# Patient Record
Sex: Female | Born: 1945
Health system: Southern US, Community
[De-identification: ages and names within clinical notes are randomized; demographics above are authoritative.]

## PROBLEM LIST (undated history)

## (undated) DIAGNOSIS — K219 Gastro-esophageal reflux disease without esophagitis: Secondary | ICD-10-CM

## (undated) DIAGNOSIS — E079 Disorder of thyroid, unspecified: Secondary | ICD-10-CM

## (undated) DIAGNOSIS — T7840XA Allergy, unspecified, initial encounter: Secondary | ICD-10-CM

## (undated) DIAGNOSIS — H269 Unspecified cataract: Secondary | ICD-10-CM

## (undated) DIAGNOSIS — M1990 Unspecified osteoarthritis, unspecified site: Secondary | ICD-10-CM

## (undated) DIAGNOSIS — M810 Age-related osteoporosis without current pathological fracture: Secondary | ICD-10-CM

## (undated) DIAGNOSIS — I10 Essential (primary) hypertension: Secondary | ICD-10-CM

## (undated) DIAGNOSIS — Z8619 Personal history of other infectious and parasitic diseases: Secondary | ICD-10-CM

## (undated) DIAGNOSIS — K635 Polyp of colon: Secondary | ICD-10-CM

## (undated) HISTORY — DX: Gastro-esophageal reflux disease without esophagitis: K21.9

## (undated) HISTORY — DX: Disorder of thyroid, unspecified: E07.9

## (undated) HISTORY — DX: Unspecified osteoarthritis, unspecified site: M19.90

## (undated) HISTORY — DX: Polyp of colon: K63.5

## (undated) HISTORY — DX: Unspecified cataract: H26.9

## (undated) HISTORY — PX: BACK SURGERY: SHX140

## (undated) HISTORY — DX: Personal history of other infectious and parasitic diseases: Z86.19

## (undated) HISTORY — DX: Age-related osteoporosis without current pathological fracture: M81.0

## (undated) HISTORY — DX: Allergy, unspecified, initial encounter: T78.40XA

## (undated) HISTORY — DX: Essential (primary) hypertension: I10

---

## 1993-11-09 HISTORY — PX: CHOLECYSTECTOMY: SHX55

## 1993-11-09 HISTORY — PX: APPENDECTOMY: SHX54

## 1999-02-21 ENCOUNTER — Ambulatory Visit (HOSPITAL_COMMUNITY): Admission: RE | Admit: 1999-02-21 | Discharge: 1999-02-21 | Payer: Self-pay | Admitting: Family Medicine

## 1999-02-21 ENCOUNTER — Encounter: Payer: Self-pay | Admitting: Family Medicine

## 1999-04-15 ENCOUNTER — Ambulatory Visit (HOSPITAL_COMMUNITY): Admission: RE | Admit: 1999-04-15 | Discharge: 1999-04-15 | Payer: Self-pay | Admitting: *Deleted

## 1999-04-15 ENCOUNTER — Encounter: Payer: Self-pay | Admitting: *Deleted

## 1999-07-17 ENCOUNTER — Other Ambulatory Visit: Admission: RE | Admit: 1999-07-17 | Discharge: 1999-07-17 | Payer: Self-pay | Admitting: *Deleted

## 1999-07-18 ENCOUNTER — Other Ambulatory Visit: Admission: RE | Admit: 1999-07-18 | Discharge: 1999-07-18 | Payer: Self-pay | Admitting: *Deleted

## 2000-01-07 ENCOUNTER — Other Ambulatory Visit: Admission: RE | Admit: 2000-01-07 | Discharge: 2000-01-07 | Payer: Self-pay | Admitting: *Deleted

## 2000-04-29 ENCOUNTER — Encounter: Admission: RE | Admit: 2000-04-29 | Discharge: 2000-04-29 | Payer: Self-pay | Admitting: *Deleted

## 2000-04-29 ENCOUNTER — Ambulatory Visit (HOSPITAL_COMMUNITY): Admission: RE | Admit: 2000-04-29 | Discharge: 2000-04-29 | Payer: Self-pay | Admitting: *Deleted

## 2000-04-29 ENCOUNTER — Encounter: Payer: Self-pay | Admitting: *Deleted

## 2000-06-22 ENCOUNTER — Other Ambulatory Visit: Admission: RE | Admit: 2000-06-22 | Discharge: 2000-06-22 | Payer: Self-pay | Admitting: *Deleted

## 2000-07-26 ENCOUNTER — Other Ambulatory Visit: Admission: RE | Admit: 2000-07-26 | Discharge: 2000-07-26 | Payer: Self-pay | Admitting: *Deleted

## 2000-07-27 ENCOUNTER — Other Ambulatory Visit: Admission: RE | Admit: 2000-07-27 | Discharge: 2000-07-27 | Payer: Self-pay | Admitting: *Deleted

## 2000-07-27 ENCOUNTER — Encounter (INDEPENDENT_AMBULATORY_CARE_PROVIDER_SITE_OTHER): Payer: Self-pay

## 2001-02-02 ENCOUNTER — Other Ambulatory Visit: Admission: RE | Admit: 2001-02-02 | Discharge: 2001-02-02 | Payer: Self-pay | Admitting: *Deleted

## 2001-05-17 ENCOUNTER — Encounter: Payer: Self-pay | Admitting: *Deleted

## 2001-05-17 ENCOUNTER — Ambulatory Visit (HOSPITAL_COMMUNITY): Admission: RE | Admit: 2001-05-17 | Discharge: 2001-05-17 | Payer: Self-pay | Admitting: *Deleted

## 2001-05-19 ENCOUNTER — Encounter: Payer: Self-pay | Admitting: *Deleted

## 2001-05-19 ENCOUNTER — Encounter: Admission: RE | Admit: 2001-05-19 | Discharge: 2001-05-19 | Payer: Self-pay | Admitting: *Deleted

## 2001-08-19 ENCOUNTER — Other Ambulatory Visit: Admission: RE | Admit: 2001-08-19 | Discharge: 2001-08-19 | Payer: Self-pay | Admitting: *Deleted

## 2001-10-22 ENCOUNTER — Emergency Department (HOSPITAL_COMMUNITY): Admission: EM | Admit: 2001-10-22 | Discharge: 2001-10-22 | Payer: Self-pay | Admitting: Emergency Medicine

## 2002-03-01 ENCOUNTER — Other Ambulatory Visit: Admission: RE | Admit: 2002-03-01 | Discharge: 2002-03-01 | Payer: Self-pay | Admitting: Obstetrics and Gynecology

## 2002-03-06 ENCOUNTER — Encounter: Payer: Self-pay | Admitting: Obstetrics and Gynecology

## 2002-03-06 ENCOUNTER — Encounter: Admission: RE | Admit: 2002-03-06 | Discharge: 2002-03-06 | Payer: Self-pay | Admitting: Obstetrics and Gynecology

## 2002-08-21 ENCOUNTER — Encounter: Admission: RE | Admit: 2002-08-21 | Discharge: 2002-08-21 | Payer: Self-pay | Admitting: Obstetrics and Gynecology

## 2002-08-21 ENCOUNTER — Encounter: Payer: Self-pay | Admitting: Obstetrics and Gynecology

## 2002-08-25 ENCOUNTER — Emergency Department (HOSPITAL_COMMUNITY): Admission: EM | Admit: 2002-08-25 | Discharge: 2002-08-25 | Payer: Self-pay | Admitting: Emergency Medicine

## 2002-08-30 ENCOUNTER — Encounter: Payer: Self-pay | Admitting: Urology

## 2002-08-30 ENCOUNTER — Encounter: Admission: RE | Admit: 2002-08-30 | Discharge: 2002-08-30 | Payer: Self-pay | Admitting: Urology

## 2002-08-31 ENCOUNTER — Ambulatory Visit (HOSPITAL_BASED_OUTPATIENT_CLINIC_OR_DEPARTMENT_OTHER): Admission: RE | Admit: 2002-08-31 | Discharge: 2002-08-31 | Payer: Self-pay | Admitting: Urology

## 2003-03-06 ENCOUNTER — Other Ambulatory Visit: Admission: RE | Admit: 2003-03-06 | Discharge: 2003-03-06 | Payer: Self-pay

## 2003-08-24 ENCOUNTER — Encounter: Admission: RE | Admit: 2003-08-24 | Discharge: 2003-08-24 | Payer: Self-pay | Admitting: Obstetrics and Gynecology

## 2003-08-24 ENCOUNTER — Encounter: Payer: Self-pay | Admitting: Obstetrics and Gynecology

## 2004-02-29 ENCOUNTER — Encounter: Admission: RE | Admit: 2004-02-29 | Discharge: 2004-02-29 | Payer: Self-pay | Admitting: Obstetrics and Gynecology

## 2004-03-18 ENCOUNTER — Other Ambulatory Visit: Admission: RE | Admit: 2004-03-18 | Discharge: 2004-03-18 | Payer: Self-pay | Admitting: Obstetrics and Gynecology

## 2004-08-29 ENCOUNTER — Encounter: Admission: RE | Admit: 2004-08-29 | Discharge: 2004-08-29 | Payer: Self-pay | Admitting: Obstetrics and Gynecology

## 2005-02-10 ENCOUNTER — Ambulatory Visit (HOSPITAL_COMMUNITY): Admission: RE | Admit: 2005-02-10 | Discharge: 2005-02-10 | Payer: Self-pay | Admitting: Family Medicine

## 2005-10-07 ENCOUNTER — Encounter: Admission: RE | Admit: 2005-10-07 | Discharge: 2005-10-07 | Payer: Self-pay | Admitting: Family Medicine

## 2005-10-16 ENCOUNTER — Ambulatory Visit (HOSPITAL_COMMUNITY): Admission: RE | Admit: 2005-10-16 | Discharge: 2005-10-16 | Payer: Self-pay | Admitting: Gastroenterology

## 2006-07-16 ENCOUNTER — Ambulatory Visit (HOSPITAL_COMMUNITY): Admission: RE | Admit: 2006-07-16 | Discharge: 2006-07-16 | Payer: Self-pay | Admitting: Gastroenterology

## 2006-07-16 ENCOUNTER — Encounter (INDEPENDENT_AMBULATORY_CARE_PROVIDER_SITE_OTHER): Payer: Self-pay | Admitting: Specialist

## 2006-10-08 ENCOUNTER — Encounter: Admission: RE | Admit: 2006-10-08 | Discharge: 2006-10-08 | Payer: Self-pay | Admitting: Obstetrics and Gynecology

## 2006-10-16 ENCOUNTER — Encounter: Admission: RE | Admit: 2006-10-16 | Discharge: 2006-10-16 | Payer: Self-pay | Admitting: Neurology

## 2006-10-20 ENCOUNTER — Encounter: Admission: RE | Admit: 2006-10-20 | Discharge: 2006-10-20 | Payer: Self-pay | Admitting: Obstetrics and Gynecology

## 2006-11-16 ENCOUNTER — Ambulatory Visit (HOSPITAL_COMMUNITY): Admission: RE | Admit: 2006-11-16 | Discharge: 2006-11-17 | Payer: Self-pay | Admitting: Neurosurgery

## 2007-08-31 ENCOUNTER — Other Ambulatory Visit: Admission: RE | Admit: 2007-08-31 | Discharge: 2007-08-31 | Payer: Self-pay | Admitting: Family Medicine

## 2007-10-21 ENCOUNTER — Encounter: Admission: RE | Admit: 2007-10-21 | Discharge: 2007-10-21 | Payer: Self-pay | Admitting: Family Medicine

## 2008-08-30 ENCOUNTER — Other Ambulatory Visit: Admission: RE | Admit: 2008-08-30 | Discharge: 2008-08-30 | Payer: Self-pay | Admitting: Family Medicine

## 2008-11-05 ENCOUNTER — Encounter: Admission: RE | Admit: 2008-11-05 | Discharge: 2008-11-05 | Payer: Self-pay | Admitting: Family Medicine

## 2009-08-08 ENCOUNTER — Encounter: Admission: RE | Admit: 2009-08-08 | Discharge: 2009-08-08 | Payer: Self-pay | Admitting: Family Medicine

## 2009-11-06 ENCOUNTER — Encounter: Admission: RE | Admit: 2009-11-06 | Discharge: 2009-11-06 | Payer: Self-pay | Admitting: Family Medicine

## 2010-11-17 ENCOUNTER — Encounter
Admission: RE | Admit: 2010-11-17 | Discharge: 2010-11-17 | Payer: Self-pay | Source: Home / Self Care | Attending: Family Medicine | Admitting: Family Medicine

## 2010-11-30 ENCOUNTER — Encounter: Payer: Self-pay | Admitting: Family Medicine

## 2011-03-27 NOTE — Op Note (Signed)
NAMEMAELYS, KINNICK NO.:  [id number]   MEDICAL RECORD NO.:  [id number]          PATIENT TYPE:  OIB   LOCATION:  3172                         FACILITY:  [facility]   PHYSICIAN:  Clydene Fake, M.D.  DATE OF BIRTH:  [birth year]   DATE OF PROCEDURE:  11/16/2006  DATE OF DISCHARGE:                               OPERATIVE REPORT   DIAGNOSIS:  Herniated nucleus pulposus left L4-5.   POSTOPERATIVE DIAGNOSIS:  Herniated nucleus pulposus left L4-5.   PROCEDURE:  Left L4-5 semi-hemilaminectomy, diskectomy, microdissection  with microscope.   SURGEON:  Clydene Fake, M.D.   ASSISTANT:  Danae Orleans. Venetia Maxon, M.D.   ANESTHESIA:  General endotracheal tube anesthesia.   ESTIMATED BLOOD LOSS:  Minimal.   FLUIDS GIVEN:  None.   DRAINS:  None.   INDICATIONS FOR PROCEDURE:  The patient is a 65 year old right-handed  woman who has had left leg pain and numbness since May.  No medications  have helped her. She was started on Neurontin.  An MRI was done then and  showed large 4-5 disk herniation left compressing the left L5 root.  The  patient brought in for decompression.   PROCEDURE IN DETAIL:  The patient was brought in the operating room,  general anesthesia was induced.  The patient was placed in prone  position on the Wilson frame with all pressure points padded.  The  patient was prepped, draped in sterile fashion.  Site of incision was  injected with 10 mL of 1% lidocaine with epinephrine.  Needle was placed  at interspace, X-ray was obtained showing this is the 5-1 interspace.  Incision was then made centered above where the needle was in the  midline lower lumbar spine, incision taken down to fascia.  Hemostasis  obtained with Bovie cauterization.  The fascia was incised over the L4-5  spinous processes.  Subperiosteal dissection was done over the spinous  process lamina out to facet.  Self-retaining tract was placed.  Marker  was placed at the 4-5 interspace.   X-rays obtained confirming our  positioning at 4-5.  Microscope was brought in for microdissection and  high-speed drill was used to start semi hemilaminectomy and medial  facetectomy with Kerrison punches.  Ligamentum flavum was removed and a  foraminotomy was done over the 5 root.  We dissected down to the  epidural space and found very large subligamentous disk herniation up  under the 5 root.  Carefully we reflected the dura and nerve root  medially, incised the disk space and diskectomy was performed with  pituitary rongeurs and curettes.  When we were finished, we had good  decompression of the thecal sac and the 5 nerve root.  We checked the 4  and 5 roots.  They were well decompressed.  We had good hemostasis with  bipolar cauterization, Gelfoam and thrombin.  Gelfoam was irrigated out.  We irrigated with antibiotic solution.  Retractors were then removed and  the fascia  closed with 0 Vicryl interrupted sutures.  Subcutaneous tissue closed  with 0, 2-0 and 3-0 Vicryl interrupted suture.  Skin closed with benzoin  and Steri-Strips.  Dressing was placed.  The patient was placed back on  supine position, awakened from anesthesia and returned to recovery room  in stable condition.           ______________________________  Clydene Fake, M.D.     JRH/MEDQ  D:  11/16/2006  T:  11/16/2006  Job:  [id number]

## 2011-03-27 NOTE — Op Note (Signed)
[name], [name] NO.:  [id number]   MEDICAL RECORD NO.:  [id number]          PATIENT TYPE:  AMB   LOCATION:  ENDO                         FACILITY:  [facility]   PHYSICIAN:  Anselmo Rod, M.D.  DATE OF BIRTH:  1946-02-03   DATE OF PROCEDURE:  10/15/2005  DATE OF DISCHARGE:                                 OPERATIVE REPORT   PROCEDURE PERFORMED:  Screening colonoscopy.   ENDOSCOPIST:  Anselmo Rod, M.D.   INSTRUMENT USED:  Olympus video colonoscope.   INDICATION FOR PROCEDURE:  A 65 year old white female undergoing screening  colonoscopy to rule out colonic polyps, masses, etc.   PREPROCEDURE PREPARATION:  Informed consent was procured from the patient.  The patient was fasted for four hours prior to the procedure and prepped  OsmoPrep pills the night of and the morning of the procedure.  The risks and  benefits of the procedure, including a 10% miss rate of cancer and polyps,  were discussed with the patient as well.   PREPROCEDURE PHYSICAL:  VITAL SIGNS:  The patient had stable vital signs.  NECK:  Supple.  CHEST:  Clear to auscultation.  S1, S2 regular.  ABDOMEN:  Soft with normal bowel sounds.   DESCRIPTION OF PROCEDURE:  The patient was placed in the left lateral  decubitus position and sedated with 75 mcg of fentanyl and 7.5 mg of Versed  in slow incremental doses.  Once the patient was adequately sedate and  maintained on low-flow oxygen and continuous cardiac monitoring, the Olympus  video colonoscope was advanced from the rectum to the cecum.  The  appendiceal orifice and the ileocecal valve were visualized and  photographed.  The terminal ileum appeared healthy.  No masses or polyps  were seen.  There were a few early diverticula seen in the distal sigmoid  colon.  Retroflexion in the rectum revealed no abnormality.  The patient  tolerated the procedure well without complication.   IMPRESSION:  Very small early diverticula seen in the  sigmoid colon,  otherwise normal colonoscopy up to the terminal ileum.   RECOMMENDATIONS:  1.Continue a high-fiber diet with liberal fluid intake.  2.Brochures on diverticulosis have been given to the patient for her  education.  3.A repeat colonoscopy has been recommended in the next 10 years unless the  patient develops any abnormal symptoms in the interim.  4.Outpatient follow-up as the need arises in the future.      Anselmo Rod, M.D.  Electronically Signed     JNM/MEDQ  D:  10/16/2005  T:  10/16/2005  Job:  [id number]   cc:   Tammy R. Collins [place], M.D.  Fax: [phone]   Maxie Better, M.D.  Fax: [phone]

## 2011-03-27 NOTE — Op Note (Signed)
NAMEKATHERYN, CULLITON NO.:  [id number]   MEDICAL RECORD NO.:  [id number]          PATIENT TYPE:  AMB   LOCATION:  ENDO                         FACILITY:  [facility]   PHYSICIAN:  Anselmo Rod, M.D.  DATE OF BIRTH:  [birth year]   DATE OF PROCEDURE:  07/16/2006  DATE OF DISCHARGE:                                 OPERATIVE REPORT   PROCEDURE PERFORMED:  Esophagogastroduodenoscopy with antral biopsies.   ENDOSCOPIST:  Anselmo Rod, M.D.   INSTRUMENT USED:  Olympus videoscopic pan endoscope.   INDICATIONS FOR PROCEDURE:  A 65 year old white female with a history of  abnormal weight loss, epigastric and right upper quadrant pain.  Patient had  a laparoscopic cholecystectomy in the past and has been on Actonel for  osteoporosis, rule out esophagitis, peptic ulcer disease, etc.   PRE-PROCEDURE PREPARATION:  Informed consent was procured from the patient.  The patient was fasted for four hours prior to the procedure.  Risks and  benefits of the procedure were discussed in great detail.   PRE-PROCEDURE PHYSICAL EXAMINATION:  VITAL SIGNS:  Stable vital signs.  NECK:  Supple.  CHEST:  Clear to auscultation.  HEART:  S1 and S2.  Regular.  ABDOMEN:  Soft with normal bowel sounds.   DESCRIPTION OF PROCEDURE:  The patient was placed in the left lateral  decubitus position and sedated with 60 mcg of fentanyl and 6 mg of Versed in  slow, incremental doses.  Once the patient was adequately sedated,  maintained on low flow oxygen and continuous cardiac monitoring, the Olympus  videoscopic pan endoscope was advanced with the mouthpiece over the tongue  into the esophagus.  Under direct vision, the entire esophagus appeared  normal with no evidence of ring, stricture, mass, esophagitis, or Barrett's  mucosa.  The scope was then advanced to the stomach.  A small amount of  sticky debris was seen in the stomach along the antrum.  Multiple washings  were done, but all of this  could not be easily washed away.  Prominent  antral folds were seen.  These were biopsied for pathology.  No ulcers,  masses, or polyps were identified.  The retroflexion into the high cardia  revealed evidence of a small hiatal hernia.  The entire esophagus and the  proximal stomach appeared normal.  The patient tolerated the procedure well  without complications.   IMPRESSION:  1.Prominent antral folds, biopsied.  2.A small hiatal hernia.  3.Sticky debris seen in the mid-body and antrum. Multiple washings done.  4.Normal-appearing esophagus and proximal small bowel.   RECOMMENDATIONS:  1.Await pathology results.  2.Follow up with Dr. Maxie Better to rule out gynecological causes for  abnormal weight loss.  3.Outpatient followup within the two weeks for further recommendations.      Anselmo Rod, M.D.  Electronically Signed     JNM/MEDQ  D:  07/16/2006  T:  07/16/2006  Job:  [id number]   cc:   Tammy R. Collins [place], M.D.  Maxie Better, M.D.

## 2011-03-27 NOTE — Op Note (Signed)
   NAME:  AZARIYA, FREEMAN NO.:  [id number]   MEDICAL RECORD NO.:  [id number]                   PATIENT TYPE:  AMB   LOCATION:  NESC                                 FACILITY:  [facility]   PHYSICIAN:  Melvyn Novas, M.D.                DATE OF BIRTH:  [birth year]   DATE OF PROCEDURE:  08/31/2002  DATE OF DISCHARGE:                                 OPERATIVE REPORT   PREOPERATIVE DIAGNOSES:  Right ureteral stones.   POSTOPERATIVE DIAGNOSES:  Right ureteral stones.   PROCEDURE:  1. Cystoscopy.  2. Right ureteroscopic laser lithotripsy.   ANESTHESIA:  General.   SURGEON:  Bertram Millard. Retta Diones, M.D.   ASSISTANT:  Melvyn Novas, M.D.   DRAINS:  None.   COMPLICATIONS:  None.   BRIEF HISTORY:  Ms. Gelardi is a 65 year old white female with a history of  right sided flank pain. A recent KUB demonstrated two large distal ureteral  stones. She therefore presents for ureteroscopic stone extraction.   DESCRIPTION OF PROCEDURE:  Following administration of antibiotics and  anesthesia, the patient was prepped and draped in the dorsal lithotomy  position. Standard cystoscopy was performed using a 22 French sheath. Of  note, prior to cystoscopy the urethra was dilated to 40 [place] using female  urethral sounds due to meatal stenosis. Under cystoscopic guidance as well  as fluoroscopic guidance, a 0.038 guidewire was used to cannulate the right  ureteral orifice and passed up the right ureter in a retrograde manner until  approximately it was in the renal pelvis. The cystoscope was then backed up  over the wire while keeping the wire in place. __________ ureteroscope was  used to perform right sided ureteroscopy. Immediately after entering the  right ureter,  approximately 2-3 cm proximal to the ureteral orifice a 3-4  mm stone was encountered. At that time, we passed a 355 mm laser fiber  through the ureteroscope and using the holmium laser proceeded to submit  the  stone to laser lithotripsy until fragments were less than 1 mm in size. I  then encountered another stone just proximal to this which was about 4 mm in  diameter and probably 8-10 mm in length. Again we performed ureteroscopic  lithotripsy until the stone was pulverized. This was performed  atraumatically. At this point, we therefore elected not to place a ureteral  stent. The ureteroscope was removed and the wire was removed as well. The  bladder was then drained and the procedure terminated.                                               Melvyn Novas, M.D.    DK/MEDQ  D:  08/31/2002  T:  08/31/2002  Job:  [id number]

## 2011-07-23 ENCOUNTER — Other Ambulatory Visit: Payer: Self-pay | Admitting: Gastroenterology

## 2011-11-18 ENCOUNTER — Other Ambulatory Visit: Payer: Self-pay | Admitting: Family Medicine

## 2011-11-18 DIAGNOSIS — M899 Disorder of bone, unspecified: Secondary | ICD-10-CM

## 2011-11-18 DIAGNOSIS — Z1231 Encounter for screening mammogram for malignant neoplasm of breast: Secondary | ICD-10-CM

## 2011-11-18 DIAGNOSIS — M949 Disorder of cartilage, unspecified: Secondary | ICD-10-CM

## 2011-11-19 IMAGING — MG MM DIGITAL SCREENING
4 series · 4 of 4 positions shown · non-contrast
Comparison: Prior studies.

DG SCREEN MAMMOGRAM BILATERAL
Bilateral CC and MLO view(s) were taken.

DIGITAL SCREENING MAMMOGRAM WITH CAD:

[R CC]
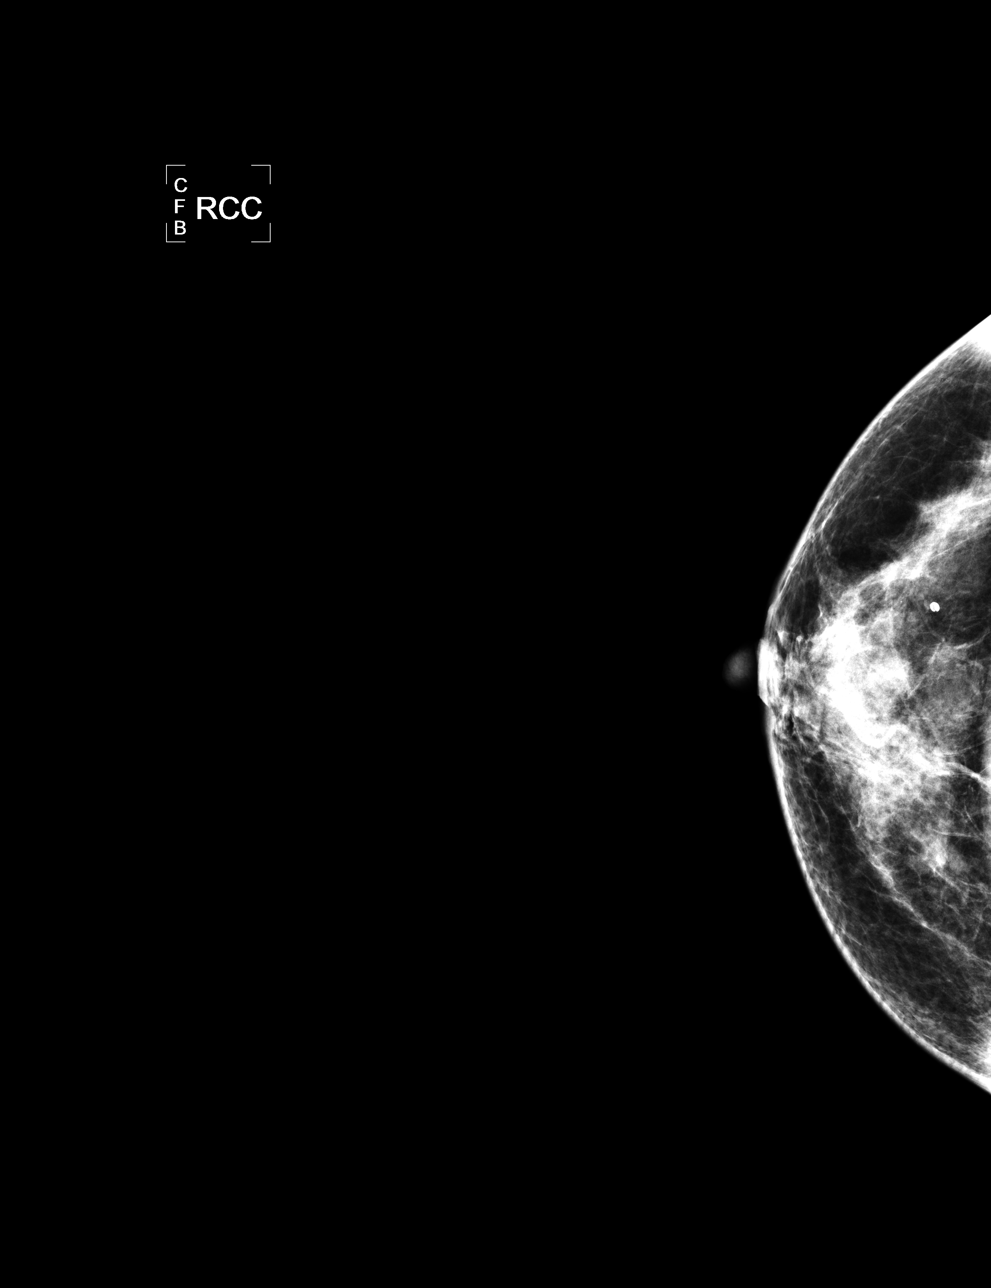

[L CC]
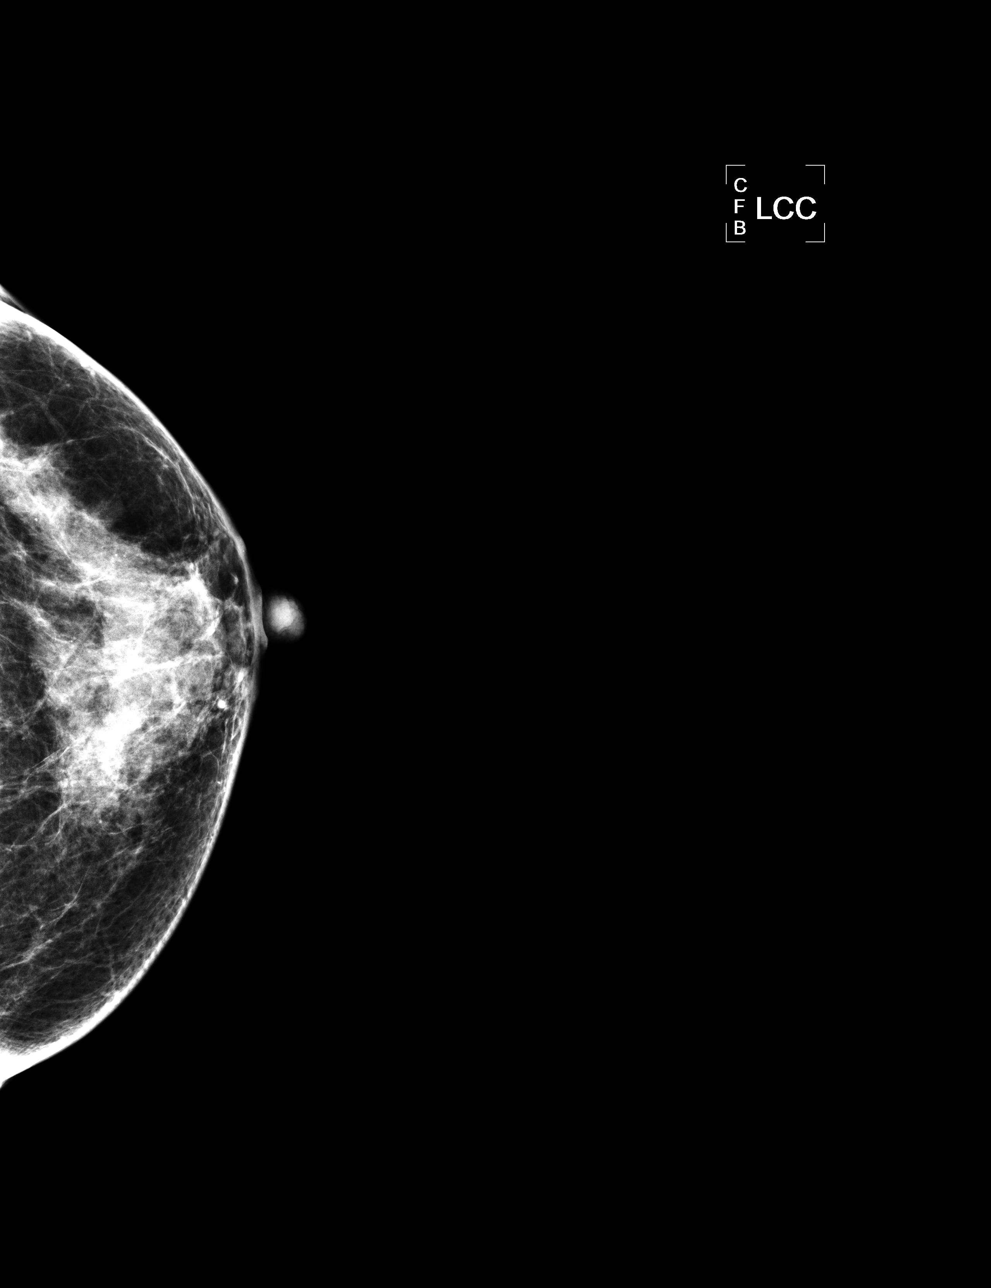

[L MLO]
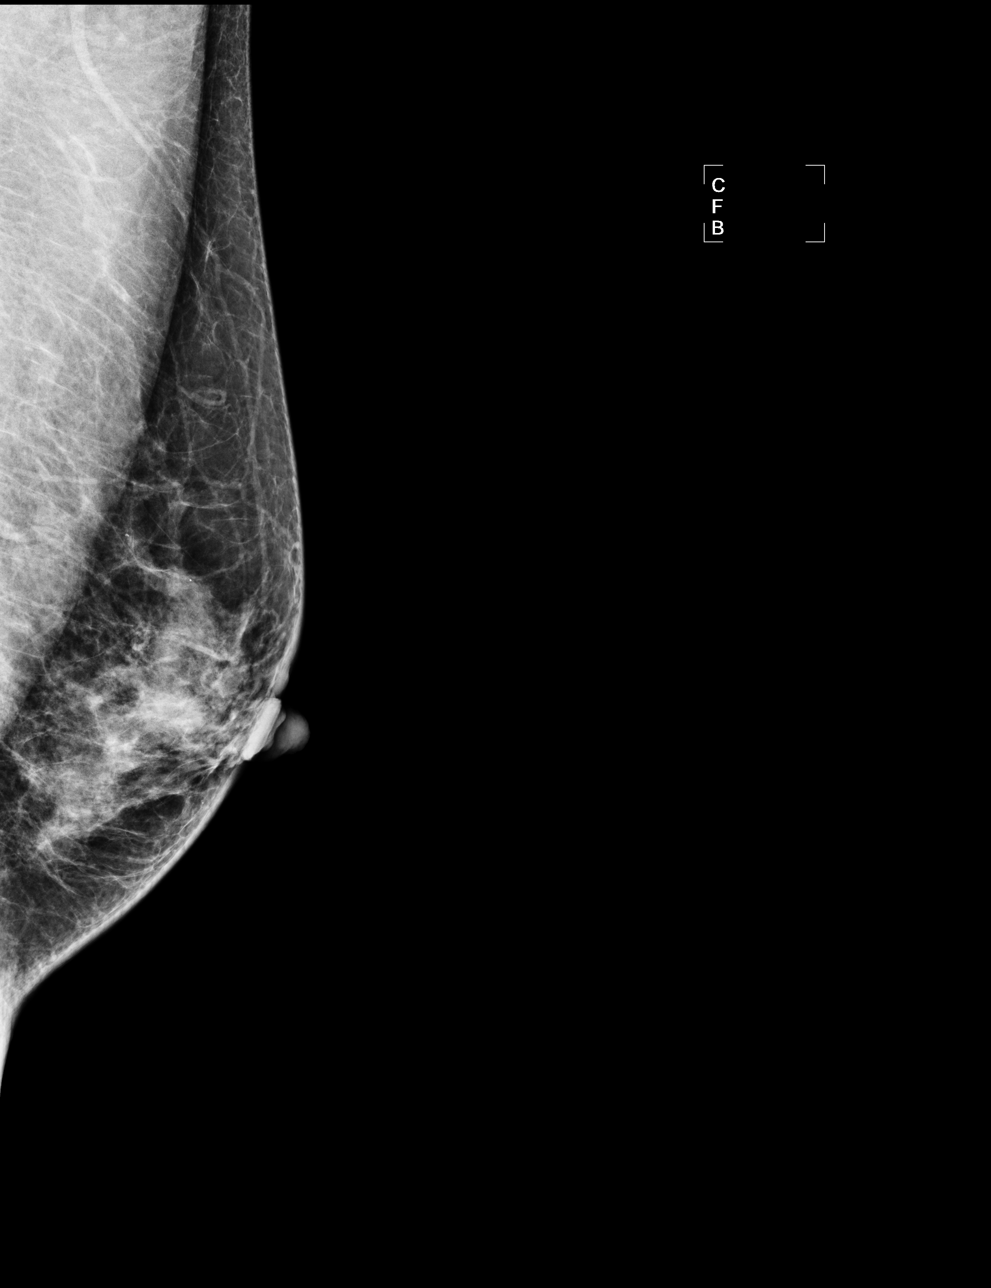

[R MLO]
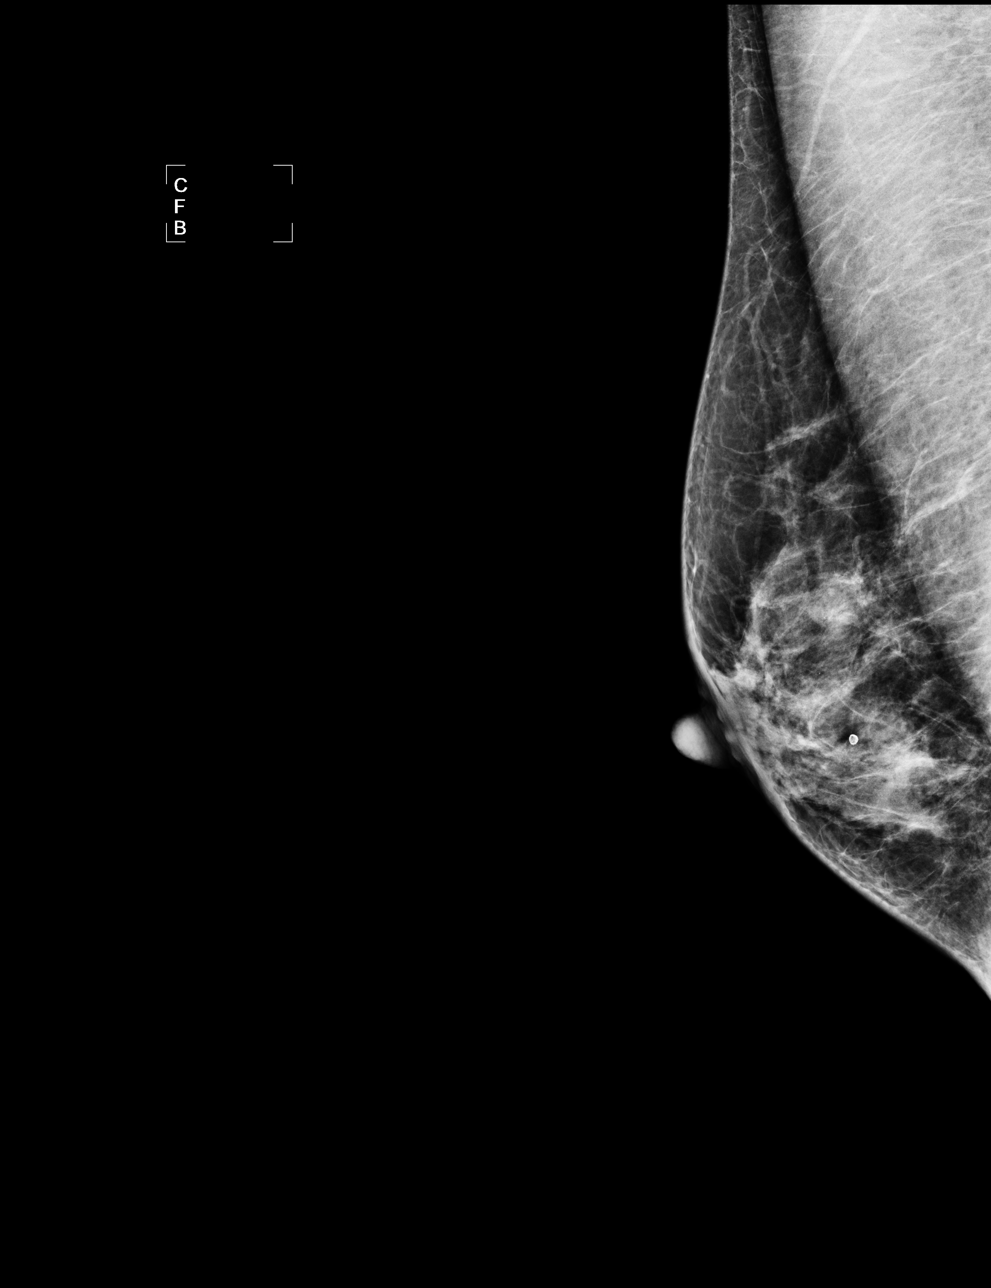

[4 of 4 positions shown; findings below may reference images not displayed]

The breast tissue is heterogeneously dense.  There is no dominant mass, architectural distortion or
calcification to suggest malignancy.

Images were processed with CAD.
IMPRESSION: No mammographic evidence of malignancy.  Suggest yearly screening mammography.

A result letter of this screening mammogram will be mailed directly to the patient.

ASSESSMENT: Negative - BI-RADS 1

Screening mammogram in 1 year.
,

## 2011-12-01 ENCOUNTER — Other Ambulatory Visit: Payer: Self-pay

## 2011-12-09 ENCOUNTER — Ambulatory Visit
Admission: RE | Admit: 2011-12-09 | Discharge: 2011-12-09 | Disposition: A | Discharge status: Home / Self Care | Payer: PRIVATE HEALTH INSURANCE | Source: Ambulatory Visit | Attending: Family Medicine | Admitting: Family Medicine

## 2011-12-09 DIAGNOSIS — M899 Disorder of bone, unspecified: Secondary | ICD-10-CM

## 2011-12-09 DIAGNOSIS — M949 Disorder of cartilage, unspecified: Secondary | ICD-10-CM

## 2011-12-09 DIAGNOSIS — Z1231 Encounter for screening mammogram for malignant neoplasm of breast: Secondary | ICD-10-CM

## 2012-12-26 ENCOUNTER — Other Ambulatory Visit: Payer: Self-pay | Admitting: Family Medicine

## 2012-12-26 DIAGNOSIS — Z1231 Encounter for screening mammogram for malignant neoplasm of breast: Secondary | ICD-10-CM

## 2013-01-24 ENCOUNTER — Ambulatory Visit
Admission: RE | Admit: 2013-01-24 | Discharge: 2013-01-24 | Disposition: A | Discharge status: Home / Self Care | Payer: BC Managed Care – PPO | Source: Ambulatory Visit | Attending: Family Medicine | Admitting: Family Medicine

## 2013-01-24 DIAGNOSIS — Z1231 Encounter for screening mammogram for malignant neoplasm of breast: Secondary | ICD-10-CM

## 2013-12-08 ENCOUNTER — Other Ambulatory Visit: Payer: Self-pay

## 2014-01-09 ENCOUNTER — Other Ambulatory Visit: Payer: Self-pay | Admitting: Family Medicine

## 2014-01-09 DIAGNOSIS — Z1231 Encounter for screening mammogram for malignant neoplasm of breast: Secondary | ICD-10-CM

## 2014-01-09 DIAGNOSIS — M8580 Other specified disorders of bone density and structure, unspecified site: Secondary | ICD-10-CM

## 2014-02-27 ENCOUNTER — Ambulatory Visit
Admission: RE | Admit: 2014-02-27 | Discharge: 2014-02-27 | Disposition: A | Discharge status: Home / Self Care | Payer: BC Managed Care – PPO | Source: Ambulatory Visit | Attending: Family Medicine | Admitting: Family Medicine

## 2014-02-27 ENCOUNTER — Encounter (INDEPENDENT_AMBULATORY_CARE_PROVIDER_SITE_OTHER): Payer: Self-pay

## 2014-02-27 DIAGNOSIS — Z1231 Encounter for screening mammogram for malignant neoplasm of breast: Secondary | ICD-10-CM

## 2014-02-27 DIAGNOSIS — M8580 Other specified disorders of bone density and structure, unspecified site: Secondary | ICD-10-CM

## 2015-01-23 ENCOUNTER — Other Ambulatory Visit: Payer: Self-pay

## 2015-01-23 DIAGNOSIS — Z1231 Encounter for screening mammogram for malignant neoplasm of breast: Secondary | ICD-10-CM

## 2015-03-01 ENCOUNTER — Other Ambulatory Visit: Payer: Self-pay

## 2015-03-01 ENCOUNTER — Ambulatory Visit
Admission: RE | Admit: 2015-03-01 | Discharge: 2015-03-01 | Disposition: A | Discharge status: Home / Self Care | Payer: BLUE CROSS/BLUE SHIELD | Source: Ambulatory Visit

## 2015-03-01 DIAGNOSIS — Z1231 Encounter for screening mammogram for malignant neoplasm of breast: Secondary | ICD-10-CM

## 2016-01-21 ENCOUNTER — Other Ambulatory Visit: Payer: Self-pay | Admitting: Family Medicine

## 2016-01-21 DIAGNOSIS — Z1231 Encounter for screening mammogram for malignant neoplasm of breast: Secondary | ICD-10-CM

## 2016-01-21 DIAGNOSIS — M8580 Other specified disorders of bone density and structure, unspecified site: Secondary | ICD-10-CM

## 2016-03-16 ENCOUNTER — Other Ambulatory Visit: Payer: BLUE CROSS/BLUE SHIELD

## 2016-03-16 ENCOUNTER — Ambulatory Visit: Payer: BLUE CROSS/BLUE SHIELD

## 2016-03-31 ENCOUNTER — Ambulatory Visit
Admission: RE | Admit: 2016-03-31 | Discharge: 2016-03-31 | Disposition: A | Discharge status: Home / Self Care | Payer: Medicare Other | Source: Ambulatory Visit | Attending: Family Medicine | Admitting: Family Medicine

## 2016-03-31 DIAGNOSIS — Z1231 Encounter for screening mammogram for malignant neoplasm of breast: Secondary | ICD-10-CM

## 2016-03-31 DIAGNOSIS — M8580 Other specified disorders of bone density and structure, unspecified site: Secondary | ICD-10-CM

## 2016-03-31 LAB — HM DEXA SCAN

## 2016-07-22 ENCOUNTER — Encounter: Payer: Self-pay | Admitting: Family Medicine

## 2016-07-22 LAB — HM COLONOSCOPY

## 2017-01-29 ENCOUNTER — Other Ambulatory Visit (HOSPITAL_COMMUNITY)
Admission: RE | Admit: 2017-01-29 | Discharge: 2017-01-29 | Disposition: A | Discharge status: Home / Self Care | Payer: Medicare Other | Source: Ambulatory Visit | Attending: Obstetrics and Gynecology | Admitting: Obstetrics and Gynecology

## 2017-01-29 ENCOUNTER — Other Ambulatory Visit: Payer: Self-pay | Admitting: Obstetrics and Gynecology

## 2017-01-29 DIAGNOSIS — Z1151 Encounter for screening for human papillomavirus (HPV): Secondary | ICD-10-CM | POA: Diagnosis not present

## 2017-01-29 DIAGNOSIS — Z01419 Encounter for gynecological examination (general) (routine) without abnormal findings: Secondary | ICD-10-CM | POA: Diagnosis not present

## 2017-01-29 DIAGNOSIS — M8580 Other specified disorders of bone density and structure, unspecified site: Secondary | ICD-10-CM | POA: Diagnosis not present

## 2017-01-29 DIAGNOSIS — N952 Postmenopausal atrophic vaginitis: Secondary | ICD-10-CM | POA: Diagnosis not present

## 2017-01-29 LAB — HM PAP SMEAR

## 2017-02-03 LAB — CYTOLOGY - PAP
Diagnosis: NEGATIVE
HPV: NOT DETECTED

## 2017-02-04 DIAGNOSIS — H0011 Chalazion right upper eyelid: Secondary | ICD-10-CM | POA: Diagnosis not present

## 2017-02-17 DIAGNOSIS — I10 Essential (primary) hypertension: Secondary | ICD-10-CM | POA: Diagnosis not present

## 2017-02-17 DIAGNOSIS — Z23 Encounter for immunization: Secondary | ICD-10-CM | POA: Diagnosis not present

## 2017-02-17 DIAGNOSIS — Z0000 Encounter for general adult medical examination without abnormal findings: Secondary | ICD-10-CM | POA: Diagnosis not present

## 2017-02-17 DIAGNOSIS — K219 Gastro-esophageal reflux disease without esophagitis: Secondary | ICD-10-CM | POA: Diagnosis not present

## 2017-02-24 ENCOUNTER — Other Ambulatory Visit: Payer: Self-pay | Admitting: Family Medicine

## 2017-02-24 DIAGNOSIS — Z1231 Encounter for screening mammogram for malignant neoplasm of breast: Secondary | ICD-10-CM

## 2017-04-06 ENCOUNTER — Ambulatory Visit
Admission: RE | Admit: 2017-04-06 | Discharge: 2017-04-06 | Disposition: A | Discharge status: Home / Self Care | Payer: Medicare Other | Source: Ambulatory Visit | Attending: Family Medicine | Admitting: Family Medicine

## 2017-04-06 DIAGNOSIS — Z1231 Encounter for screening mammogram for malignant neoplasm of breast: Secondary | ICD-10-CM

## 2017-06-15 DIAGNOSIS — E559 Vitamin D deficiency, unspecified: Secondary | ICD-10-CM | POA: Diagnosis not present

## 2017-06-15 DIAGNOSIS — L659 Nonscarring hair loss, unspecified: Secondary | ICD-10-CM | POA: Diagnosis not present

## 2017-06-15 DIAGNOSIS — K219 Gastro-esophageal reflux disease without esophagitis: Secondary | ICD-10-CM | POA: Diagnosis not present

## 2017-06-15 DIAGNOSIS — Z79899 Other long term (current) drug therapy: Secondary | ICD-10-CM | POA: Diagnosis not present

## 2017-06-17 DIAGNOSIS — Z23 Encounter for immunization: Secondary | ICD-10-CM | POA: Diagnosis not present

## 2018-01-20 DIAGNOSIS — J069 Acute upper respiratory infection, unspecified: Secondary | ICD-10-CM | POA: Diagnosis not present

## 2018-02-03 DIAGNOSIS — Z01411 Encounter for gynecological examination (general) (routine) with abnormal findings: Secondary | ICD-10-CM | POA: Diagnosis not present

## 2018-02-03 DIAGNOSIS — R635 Abnormal weight gain: Secondary | ICD-10-CM | POA: Diagnosis not present

## 2018-02-03 DIAGNOSIS — L659 Nonscarring hair loss, unspecified: Secondary | ICD-10-CM | POA: Diagnosis not present

## 2018-02-03 LAB — TSH: TSH: 6.72 — AB (ref <=5.90)

## 2018-02-18 DIAGNOSIS — Z13220 Encounter for screening for lipoid disorders: Secondary | ICD-10-CM | POA: Diagnosis not present

## 2018-02-18 DIAGNOSIS — Z131 Encounter for screening for diabetes mellitus: Secondary | ICD-10-CM | POA: Diagnosis not present

## 2018-02-18 DIAGNOSIS — Z0000 Encounter for general adult medical examination without abnormal findings: Secondary | ICD-10-CM | POA: Diagnosis not present

## 2018-02-18 DIAGNOSIS — I10 Essential (primary) hypertension: Secondary | ICD-10-CM | POA: Diagnosis not present

## 2018-02-18 DIAGNOSIS — N951 Menopausal and female climacteric states: Secondary | ICD-10-CM | POA: Diagnosis not present

## 2018-02-18 DIAGNOSIS — K219 Gastro-esophageal reflux disease without esophagitis: Secondary | ICD-10-CM | POA: Diagnosis not present

## 2018-02-18 DIAGNOSIS — M25511 Pain in right shoulder: Secondary | ICD-10-CM | POA: Diagnosis not present

## 2018-02-18 LAB — BASIC METABOLIC PANEL
BUN: 20 (ref 4–21)
Creatinine: 0.8 (ref 0.5–1.1)
Glucose: 97
Potassium: 4.3 (ref 3.4–5.3)
Sodium: 140 (ref 137–147)

## 2018-02-18 LAB — LIPID PANEL
Cholesterol: 165 (ref 0–200)
HDL: 68 (ref 35–70)
LDL Cholesterol: 86
LDl/HDL Ratio: 2.4
Triglycerides: 55 (ref 40–160)

## 2018-02-18 LAB — HEPATIC FUNCTION PANEL
ALT: 20 (ref 7–35)
AST: 19 (ref 13–35)
Alkaline Phosphatase: 47 (ref 25–125)
Bilirubin, Total: 0.5

## 2018-02-18 LAB — HM MAMMOGRAPHY

## 2018-02-25 ENCOUNTER — Other Ambulatory Visit: Payer: Self-pay | Admitting: Internal Medicine

## 2018-02-25 DIAGNOSIS — Z1231 Encounter for screening mammogram for malignant neoplasm of breast: Secondary | ICD-10-CM

## 2018-03-25 DIAGNOSIS — E039 Hypothyroidism, unspecified: Secondary | ICD-10-CM | POA: Diagnosis not present

## 2018-03-30 DIAGNOSIS — E611 Iron deficiency: Secondary | ICD-10-CM | POA: Diagnosis not present

## 2018-03-30 DIAGNOSIS — E039 Hypothyroidism, unspecified: Secondary | ICD-10-CM | POA: Diagnosis not present

## 2018-03-31 DIAGNOSIS — M8588 Other specified disorders of bone density and structure, other site: Secondary | ICD-10-CM | POA: Diagnosis not present

## 2018-03-31 DIAGNOSIS — M810 Age-related osteoporosis without current pathological fracture: Secondary | ICD-10-CM | POA: Diagnosis not present

## 2018-03-31 LAB — HM DEXA SCAN

## 2018-04-07 ENCOUNTER — Ambulatory Visit
Admission: RE | Admit: 2018-04-07 | Discharge: 2018-04-07 | Disposition: A | Discharge status: Home / Self Care | Payer: Medicare Other | Source: Ambulatory Visit | Attending: Internal Medicine | Admitting: Internal Medicine

## 2018-04-07 DIAGNOSIS — Z1231 Encounter for screening mammogram for malignant neoplasm of breast: Secondary | ICD-10-CM | POA: Diagnosis not present

## 2018-05-20 DIAGNOSIS — H35371 Puckering of macula, right eye: Secondary | ICD-10-CM | POA: Diagnosis not present

## 2018-05-20 DIAGNOSIS — H25043 Posterior subcapsular polar age-related cataract, bilateral: Secondary | ICD-10-CM | POA: Diagnosis not present

## 2018-06-13 DIAGNOSIS — R350 Frequency of micturition: Secondary | ICD-10-CM | POA: Diagnosis not present

## 2018-06-13 DIAGNOSIS — N3000 Acute cystitis without hematuria: Secondary | ICD-10-CM | POA: Diagnosis not present

## 2018-06-14 DIAGNOSIS — E039 Hypothyroidism, unspecified: Secondary | ICD-10-CM | POA: Diagnosis not present

## 2018-06-14 DIAGNOSIS — E611 Iron deficiency: Secondary | ICD-10-CM | POA: Diagnosis not present

## 2018-07-20 ENCOUNTER — Encounter: Payer: Self-pay | Admitting: Family Medicine

## 2018-07-20 DIAGNOSIS — M150 Primary generalized (osteo)arthritis: Secondary | ICD-10-CM | POA: Diagnosis not present

## 2018-07-20 DIAGNOSIS — M25512 Pain in left shoulder: Secondary | ICD-10-CM | POA: Diagnosis not present

## 2018-07-20 DIAGNOSIS — Z6824 Body mass index (BMI) 24.0-24.9, adult: Secondary | ICD-10-CM | POA: Diagnosis not present

## 2018-07-20 DIAGNOSIS — M25511 Pain in right shoulder: Secondary | ICD-10-CM | POA: Diagnosis not present

## 2018-07-20 DIAGNOSIS — M810 Age-related osteoporosis without current pathological fracture: Secondary | ICD-10-CM | POA: Diagnosis not present

## 2018-08-23 ENCOUNTER — Encounter: Payer: Self-pay | Admitting: Family Medicine

## 2018-08-23 ENCOUNTER — Other Ambulatory Visit: Payer: Self-pay

## 2018-08-23 ENCOUNTER — Ambulatory Visit (INDEPENDENT_AMBULATORY_CARE_PROVIDER_SITE_OTHER): Payer: Medicare Other | Admitting: Family Medicine

## 2018-08-23 VITALS — BP 128/80 | HR 79 | Temp 98.7°F | Resp 14 | Ht 60.0 in | Wt 122.0 lb

## 2018-08-23 DIAGNOSIS — K219 Gastro-esophageal reflux disease without esophagitis: Secondary | ICD-10-CM | POA: Diagnosis not present

## 2018-08-23 DIAGNOSIS — L659 Nonscarring hair loss, unspecified: Secondary | ICD-10-CM

## 2018-08-23 DIAGNOSIS — M810 Age-related osteoporosis without current pathological fracture: Secondary | ICD-10-CM | POA: Diagnosis not present

## 2018-08-23 DIAGNOSIS — N3281 Overactive bladder: Secondary | ICD-10-CM | POA: Diagnosis not present

## 2018-08-23 DIAGNOSIS — E079 Disorder of thyroid, unspecified: Secondary | ICD-10-CM | POA: Diagnosis not present

## 2018-08-23 DIAGNOSIS — I10 Essential (primary) hypertension: Secondary | ICD-10-CM | POA: Diagnosis not present

## 2018-08-23 LAB — CBC WITH DIFFERENTIAL/PLATELET
Basophils Absolute: 0 10*3/uL (ref 0.0–0.1)
Basophils Relative: 0.7 % (ref 0.0–3.0)
Eosinophils Absolute: 0 10*3/uL (ref 0.0–0.7)
Eosinophils Relative: 0.9 % (ref 0.0–5.0)
HCT: 40.6 % (ref 36.0–46.0)
Hemoglobin: 13.8 g/dL (ref 12.0–15.0)
Lymphocytes Relative: 21.8 % (ref 12.0–46.0)
Lymphs Abs: 1.1 10*3/uL (ref 0.7–4.0)
MCHC: 34 g/dL (ref 30.0–36.0)
MCV: 92.9 fl (ref 78.0–100.0)
Monocytes Absolute: 0.4 10*3/uL (ref 0.1–1.0)
Monocytes Relative: 7.4 % (ref 3.0–12.0)
Neutro Abs: 3.6 10*3/uL (ref 1.4–7.7)
Neutrophils Relative %: 69.2 % (ref 43.0–77.0)
Platelets: 235 10*3/uL (ref 150.0–400.0)
RBC: 4.37 Mil/uL (ref 3.87–5.11)
RDW: 12.8 % (ref 11.5–15.5)
WBC: 5.2 10*3/uL (ref 4.0–10.5)

## 2018-08-23 LAB — TSH: TSH: 5.78 u[IU]/mL — ABNORMAL HIGH (ref 0.35–4.50)

## 2018-08-23 LAB — HEPATIC FUNCTION PANEL
ALT: 21 U/L (ref 0–35)
AST: 19 U/L (ref 0–37)
Albumin: 4.9 g/dL (ref 3.5–5.2)
Alkaline Phosphatase: 43 U/L (ref 39–117)
Bilirubin, Direct: 0.1 mg/dL (ref 0.0–0.3)
Total Bilirubin: 0.4 mg/dL (ref 0.2–1.2)
Total Protein: 7.6 g/dL (ref 6.0–8.3)

## 2018-08-23 LAB — LIPID PANEL
Cholesterol: 191 mg/dL (ref 0–200)
HDL: 73.2 mg/dL (ref >39.00)
LDL Cholesterol: 103 mg/dL — ABNORMAL HIGH (ref 0–99)
NonHDL: 117.58
Total CHOL/HDL Ratio: 3
Triglycerides: 73 mg/dL (ref 0.0–149.0)
VLDL: 14.6 mg/dL (ref 0.0–40.0)

## 2018-08-23 LAB — BASIC METABOLIC PANEL
BUN: 19 mg/dL (ref 6–23)
CO2: 30 mEq/L (ref 19–32)
Calcium: 10 mg/dL (ref 8.4–10.5)
Chloride: 104 mEq/L (ref 96–112)
Creatinine, Ser: 0.95 mg/dL (ref 0.40–1.20)
GFR: 61.79 mL/min (ref >60.00)
Glucose, Bld: 100 mg/dL — ABNORMAL HIGH (ref 70–99)
Potassium: 4.1 mEq/L (ref 3.5–5.1)
Sodium: 140 mEq/L (ref 135–145)

## 2018-08-23 LAB — T4, FREE: Free T4: 0.77 ng/dL (ref 0.60–1.60)

## 2018-08-23 LAB — T3, FREE: T3, Free: 3.3 pg/mL (ref 2.3–4.2)

## 2018-08-23 LAB — VITAMIN D 25 HYDROXY (VIT D DEFICIENCY, FRACTURES): VITD: 37.98 ng/mL (ref 30.00–100.00)

## 2018-08-23 NOTE — Patient Instructions (Signed)
Follow up in 6 months to recheck BP (sooner if needed) We'll notify you of your lab results and make any changes if needed Continue to work on healthy diet and regular exercise- you look great! Call with any questions or concerns Welcome!  We're glad to have you!

## 2018-08-23 NOTE — Progress Notes (Signed)
   Subjective:    Patient ID: Glenda Rios, female    DOB: 07/18/1946, 72 y.o.   MRN: [id number]  HPI New to establish.  Previous MD- [facility] team updated in chart  HTN- chronic problem, on Amlodipine 5mg  daily but stopped BP meds on 7/16.  home BPs have been 120-130s/60-70s.  Denies CP, SOB, HAs, visual changes, edema.  OAB- chronic problem, on Oxybutynin daily.  sxs are currently well controlled.  Seeing Dr Eulogio Ditch  GERD- chronic problem, on Omeprazole 40mg  daily.  Pt reports sxs are well controlled.  Osteoporosis- was referred by GYN to Rheum.  Was not able to tolerate Prolia (joint pains), unable to tolerate bisphosphonates due to GERD.  Was told by Dr Trudie Reed not to treat at this time and wait for next DEXA.  Pt is on Ca and Vit D, walking regularly.  Thyroid disease- pt was previously on thyroid medication but was taken off this when thyroid levels were 'all normal'.  She was suffering from hair loss last year and was told thyroid and iron levels were normal.  Pt reports she is continuing to lose hair.   Review of Systems For ROS see HPI     Objective:   Physical Exam  Constitutional: She is oriented to person, place, and time. She appears well-developed and well-nourished. No distress.  HENT:  Head: Normocephalic and atraumatic.  Eyes: Pupils are equal, round, and reactive to light. Conjunctivae and EOM are normal.  Neck: Normal range of motion. Neck supple. No thyromegaly present.  Cardiovascular: Normal rate, regular rhythm, normal heart sounds and intact distal pulses.  No murmur heard. Pulmonary/Chest: Effort normal and breath sounds normal. No respiratory distress.  Abdominal: Soft. She exhibits no distension. There is no tenderness.  Musculoskeletal: She exhibits no edema.  Lymphadenopathy:    She has no cervical adenopathy.  Neurological: She is alert and oriented to person, place, and time.  Skin: Skin is warm and dry.  Psychiatric: She has a normal mood  and affect. Her behavior is normal.  Vitals reviewed.         Assessment & Plan:  Hair loss- ongoing issue for pt.  She reports she had low Ferritin at one point and was on an iron supplement.  She was also told she had a thyroid issue but is not currently on medication.  Check labs and treat any abnormalities if present.

## 2018-08-23 NOTE — Assessment & Plan Note (Signed)
New to provider, ongoing for pt.  sxs are well controlled on Omeprazole as long as she avoids NSAIDs or Fosamax.  No changes at this time

## 2018-08-23 NOTE — Assessment & Plan Note (Signed)
New to provider, ongoing for pt.  She has been on and off thyroid medication x2.  She continues to lose hair and this is upsetting to her.  Check labs and determine if meds are again needed.

## 2018-08-23 NOTE — Assessment & Plan Note (Signed)
New to provider, ongoing for pt.  She has seen Rheum for this who recommended holding off on tx at this time.  Check Vit D and replete prn.

## 2018-08-23 NOTE — Assessment & Plan Note (Signed)
New to provider, ongoing for pt.  sxs are well controlled.  No med changes at this time.  Will continue to follow.

## 2018-08-23 NOTE — Assessment & Plan Note (Signed)
New to provider, ongoing for pt.  Asymptomatic at this time.  No need to continue Amlodipine w/ her current BP readings.  Will follow.

## 2018-08-24 ENCOUNTER — Other Ambulatory Visit: Payer: Self-pay | Admitting: Family Medicine

## 2018-08-24 DIAGNOSIS — E039 Hypothyroidism, unspecified: Secondary | ICD-10-CM

## 2018-08-24 LAB — IRON,TIBC AND FERRITIN PANEL
%SAT: 27 % (calc) (ref 16–45)
Ferritin: 45 ng/mL (ref 16–288)
Iron: 88 ug/dL (ref 45–160)
TIBC: 321 mcg/dL (calc) (ref 250–450)

## 2018-08-24 MED ORDER — LEVOTHYROXINE SODIUM 50 MCG PO TABS: 50.0000 ug | ORAL_TABLET | Freq: Every day | ORAL | 3 refills | Status: DC

## 2018-09-12 ENCOUNTER — Encounter: Payer: Self-pay | Admitting: General Practice

## 2018-09-26 ENCOUNTER — Other Ambulatory Visit: Payer: Medicare Other

## 2018-12-26 ENCOUNTER — Other Ambulatory Visit: Payer: Self-pay | Admitting: Family Medicine

## 2018-12-26 ENCOUNTER — Ambulatory Visit: Payer: Self-pay | Admitting: *Deleted

## 2018-12-26 DIAGNOSIS — E039 Hypothyroidism, unspecified: Secondary | ICD-10-CM

## 2018-12-26 NOTE — Telephone Encounter (Signed)
Patient would like to know if she is suppose to continue taking levothyroxine (SYNTHROID, LEVOTHROID) 50 MCG tablet?  Reason for Disposition . Caller has NON-URGENT medication question about med that PCP prescribed and triager unable to answer question  Answer Assessment - Initial Assessment Questions 1. SYMPTOMS: "Do you have any symptoms?"     Patient wants to know if she needs to continue on same dosage. 2. SEVERITY: If symptoms are present, ask "Are they mild, moderate or severe?"     Hair loss- still present- maybe a bit worse  Protocols used: MEDICATION QUESTION CALL-A-AH  Appointment made for lab to see if patient is within the normal range.Patient will get refill after results are reviewed.

## 2018-12-26 NOTE — Telephone Encounter (Signed)
Labs ordered.

## 2018-12-27 ENCOUNTER — Telehealth: Payer: Self-pay

## 2018-12-27 ENCOUNTER — Other Ambulatory Visit: Payer: Medicare Other

## 2018-12-27 MED ORDER — LEVOTHYROXINE SODIUM 50 MCG PO TABS: 50.0000 ug | ORAL_TABLET | Freq: Every day | ORAL | 3 refills | Status: DC

## 2018-12-27 NOTE — Addendum Note (Signed)
Addended by: Davis Gourd on: 12/27/2018 08:45 AM   Modules accepted: Orders

## 2018-12-27 NOTE — Telephone Encounter (Signed)
Medication filled to pharmacy as requested.   

## 2018-12-27 NOTE — Telephone Encounter (Signed)
Copied from [place] [phone]. Topic: General - Other >> Dec 27, 2018  7:49 AM Carolyn Stare wrote:  Pt had a lab appt this morning for a TSH but cancel cause she is not feeling well and will call back to reschedule. She ask if a few pills can be called in for her until she can reschedule, she only has a pill for today    levothyroxine (SYNTHROID, LEVOTHROID) 50 MCG tablet

## 2018-12-28 ENCOUNTER — Ambulatory Visit (INDEPENDENT_AMBULATORY_CARE_PROVIDER_SITE_OTHER): Payer: Medicare Other | Admitting: Family Medicine

## 2018-12-28 ENCOUNTER — Encounter: Payer: Self-pay | Admitting: Family Medicine

## 2018-12-28 ENCOUNTER — Other Ambulatory Visit: Payer: Self-pay

## 2018-12-28 VITALS — BP 130/90 | HR 73 | Temp 98.3°F | Resp 16 | Ht 60.0 in | Wt 119.1 lb

## 2018-12-28 DIAGNOSIS — J069 Acute upper respiratory infection, unspecified: Secondary | ICD-10-CM

## 2018-12-28 DIAGNOSIS — B9789 Other viral agents as the cause of diseases classified elsewhere: Secondary | ICD-10-CM | POA: Diagnosis not present

## 2018-12-28 MED ORDER — AZELASTINE HCL 137 MCG/SPRAY NA SOLN: 2.0000 | Freq: Two times a day (BID) | NASAL | 3 refills | Status: DC

## 2018-12-28 MED ORDER — GUAIFENESIN-CODEINE 100-10 MG/5ML PO SYRP: 10.0000 mL | ORAL_SOLUTION | Freq: Three times a day (TID) | ORAL | 0 refills | Status: DC | PRN

## 2018-12-28 NOTE — Patient Instructions (Signed)
Follow up as needed or as scheduled CONTINUE your allergy medication ADD the nasal spray- 2 sprays twice daily Use the cough medicine as needed- may cause drowsiness ADD Mucinex to help w/ chest congestion and cough Drink plenty of fluids REST! Call with any questions or concerns Hang in there!

## 2018-12-28 NOTE — Progress Notes (Signed)
   Subjective:    Patient ID: Glenda Rios, female    DOB: [birth year], 73 y.o.   MRN: [id number]  HPI URI- pt reports Tm 101 last night.  'I just thought it was my allergies the first of the week'.  Feeling somewhat better today.  Continues to cough- unable to sleep.  + HA- this is improving.  Some sinus pressure.  No tooth pain.  No ear pain.  No N/V.  Denies body aches.  No known sick contacts.     Review of Systems For ROS see HPI     Objective:   Physical Exam Vitals signs reviewed.  Constitutional:      General: She is not in acute distress.    Appearance: She is well-developed.  HENT:     Head: Normocephalic and atraumatic.     Right Ear: Tympanic membrane normal.     Left Ear: Tympanic membrane normal.     Nose: Mucosal edema and rhinorrhea present.     Right Sinus: No maxillary sinus tenderness or frontal sinus tenderness.     Left Sinus: No maxillary sinus tenderness or frontal sinus tenderness.     Mouth/Throat:     Pharynx: Posterior oropharyngeal erythema (w/ PND) present.  Eyes:     Conjunctiva/sclera: Conjunctivae normal.     Pupils: Pupils are equal, round, and reactive to light.  Neck:     Musculoskeletal: Normal range of motion and neck supple.  Cardiovascular:     Rate and Rhythm: Normal rate and regular rhythm.     Heart sounds: Normal heart sounds.  Pulmonary:     Effort: Pulmonary effort is normal. No respiratory distress.     Breath sounds: Normal breath sounds. No wheezing or rales.     Comments: No cough heard Lymphadenopathy:     Cervical: No cervical adenopathy.  Psychiatric:        Mood and Affect: Mood normal.        Behavior: Behavior normal.           Assessment & Plan:  Viral illness- new.  No evidence of bacterial infxn on PE.  No need for abx.  Cough meds prn.  Continue allergy pill- add nasal spray.  Reviewed supportive care and red flags that should prompt return.  Pt expressed understanding and is in agreement w/ plan.

## 2019-03-02 ENCOUNTER — Ambulatory Visit (INDEPENDENT_AMBULATORY_CARE_PROVIDER_SITE_OTHER): Payer: Medicare Other | Admitting: Family Medicine

## 2019-03-02 ENCOUNTER — Ambulatory Visit: Payer: Medicare Other

## 2019-03-02 ENCOUNTER — Ambulatory Visit: Payer: Medicare Other | Admitting: Family Medicine

## 2019-03-02 ENCOUNTER — Encounter: Payer: Self-pay | Admitting: Family Medicine

## 2019-03-02 VITALS — BP 135/63 | HR 63 | Ht 60.0 in | Wt 118.0 lb

## 2019-03-02 DIAGNOSIS — E079 Disorder of thyroid, unspecified: Secondary | ICD-10-CM

## 2019-03-02 DIAGNOSIS — I10 Essential (primary) hypertension: Secondary | ICD-10-CM

## 2019-03-02 NOTE — Progress Notes (Signed)
I have discussed the procedure for the virtual visit with the patient who has given consent to proceed with assessment and treatment.   BETHANY DILLARD, CMA     

## 2019-03-02 NOTE — Progress Notes (Signed)
   Virtual Visit via Video   I connected with patient on 03/02/19 at  3:00 PM EDT by a video enabled telemedicine application and verified that I am speaking with the correct person using two identifiers.  Location patient: Home Location provider: [facility] Persons participating in the virtual visit: Patient, Provider, Heath ([place])  I discussed the limitations of evaluation and management by telemedicine and the availability of in person appointments. The patient expressed understanding and agreed to proceed.  Subjective:   HPI:   HTN- adequate control since stopping Amlodipine this fall.  She is checking home BPs regularly- highest has been 135/66.  Continues to do stationary bike and walking.  No CP, SOB, HAs, visual changes, edema.  Hypothyroid- chronic problem, on Levothyroxine 22mcg daily.  Denies changes to skin/hair/nails or excessive fatigue.  ROS:   See pertinent positives and negatives per HPI.  Patient Active Problem List   Diagnosis Date Noted  . HTN (hypertension) 08/23/2018  . OAB (overactive bladder) 08/23/2018  . GERD (gastroesophageal reflux disease) 08/23/2018  . Osteoporosis 08/23/2018  . Thyroid disease 08/23/2018    Social History   Tobacco Use  . Smoking status: Never Smoker  . Smokeless tobacco: Never Used  Substance Use Topics  . Alcohol use: Not Currently    Current Outpatient Medications:  .  calcium citrate-vitamin D (CITRACAL+D) 315-200 MG-UNIT tablet, Take 1 tablet by mouth 2 (two) times daily., Disp: , Rfl:  .  Coenzyme Q10 (CO Q10) 60 MG CAPS, Take 1 capsule by mouth daily., Disp: , Rfl:  .  Cranberry 600 MG TABS, Take 1 tablet by mouth daily., Disp: , Rfl:  .  Cyanocobalamin (TH VITAMIN B-12 PO), Take 1 tablet by mouth daily., Disp: , Rfl:  .  levothyroxine (SYNTHROID, LEVOTHROID) 50 MCG tablet, Take 1 tablet (50 mcg total) by mouth daily., Disp: 30 tablet, Rfl: 3 .  Lysine 500 MG CAPS, Take 1 capsule by mouth  daily., Disp: , Rfl:  .  omeprazole (PRILOSEC) 40 MG capsule, Take 40 mg by mouth daily., Disp: , Rfl: 3 .  oxybutynin (DITROPAN-XL) 10 MG 24 hr tablet, Take 10 mg by mouth daily., Disp: , Rfl: 5 .  Probiotic Product (PROBIOTIC-10) CAPS, Take 1 capsule by mouth daily., Disp: , Rfl:  .  Azelastine HCl 137 MCG/SPRAY SOLN, Place 2 sprays into the nose 2 (two) times daily., Disp: 1 Bottle, Rfl: 3 .  guaiFENesin-codeine (ROBITUSSIN AC) 100-10 MG/5ML syrup, Take 10 mLs by mouth 3 (three) times daily as needed for cough., Disp: 120 mL, Rfl: 0  Allergies  Allergen Reactions  . Iohexol Anaphylaxis     Desc: hives,rash,swelling. entered 02/10/2005 bsw   . Codeine Nausea Only    Objective:   BP 135/63   Pulse 63   Ht 5' (1.524 m)   Wt 118 lb (53.5 kg)   BMI 23.05 kg/m   AAOx3, NAD NCAT, EOMI No obvious CN deficits Coloring WNL Pt is able to speak clearly, coherently without shortness of breath or increased work of breathing.  Thought process is linear.  Mood is appropriate.   Assessment and Plan:   HTN- chronic problem.  Well controlled w/o medication.  Asymptomatic.  No plans to start medication at this time  Hypothyroid- chronic problem.  Currently asymptomatic.  Check labs.  Adjust meds prn    Annye Asa, MD 03/02/2019

## 2019-03-07 ENCOUNTER — Other Ambulatory Visit (INDEPENDENT_AMBULATORY_CARE_PROVIDER_SITE_OTHER): Payer: Medicare Other

## 2019-03-07 DIAGNOSIS — E039 Hypothyroidism, unspecified: Secondary | ICD-10-CM

## 2019-03-07 DIAGNOSIS — I10 Essential (primary) hypertension: Secondary | ICD-10-CM

## 2019-03-07 DIAGNOSIS — E079 Disorder of thyroid, unspecified: Secondary | ICD-10-CM | POA: Diagnosis not present

## 2019-03-07 LAB — BASIC METABOLIC PANEL
BUN: 20 mg/dL (ref 6–23)
CO2: 28 mEq/L (ref 19–32)
Calcium: 9.4 mg/dL (ref 8.4–10.5)
Chloride: 103 mEq/L (ref 96–112)
Creatinine, Ser: 0.87 mg/dL (ref 0.40–1.20)
GFR: 64.25 mL/min (ref >60.00)
Glucose, Bld: 97 mg/dL (ref 70–99)
Potassium: 4.1 mEq/L (ref 3.5–5.1)
Sodium: 139 mEq/L (ref 135–145)

## 2019-03-07 LAB — CBC WITH DIFFERENTIAL/PLATELET
Basophils Absolute: 0.1 10*3/uL (ref 0.0–0.1)
Basophils Relative: 0.9 % (ref 0.0–3.0)
Eosinophils Absolute: 0.1 10*3/uL (ref 0.0–0.7)
Eosinophils Relative: 1.1 % (ref 0.0–5.0)
HCT: 40 % (ref 36.0–46.0)
Hemoglobin: 13.6 g/dL (ref 12.0–15.0)
Lymphocytes Relative: 28.4 % (ref 12.0–46.0)
Lymphs Abs: 1.6 10*3/uL (ref 0.7–4.0)
MCHC: 34 g/dL (ref 30.0–36.0)
MCV: 93.7 fl (ref 78.0–100.0)
Monocytes Absolute: 0.4 10*3/uL (ref 0.1–1.0)
Monocytes Relative: 8.1 % (ref 3.0–12.0)
Neutro Abs: 3.4 10*3/uL (ref 1.4–7.7)
Neutrophils Relative %: 61.5 % (ref 43.0–77.0)
Platelets: 226 10*3/uL (ref 150.0–400.0)
RBC: 4.27 Mil/uL (ref 3.87–5.11)
RDW: 13 % (ref 11.5–15.5)
WBC: 5.5 10*3/uL (ref 4.0–10.5)

## 2019-03-07 LAB — TSH: TSH: 5.62 u[IU]/mL — ABNORMAL HIGH (ref 0.35–4.50)

## 2019-03-08 ENCOUNTER — Other Ambulatory Visit: Payer: Self-pay | Admitting: Family Medicine

## 2019-03-08 DIAGNOSIS — E039 Hypothyroidism, unspecified: Secondary | ICD-10-CM

## 2019-03-08 MED ORDER — LEVOTHYROXINE SODIUM 75 MCG PO TABS: 75.0000 ug | ORAL_TABLET | Freq: Every day | ORAL | 1 refills | Status: DC

## 2019-04-07 ENCOUNTER — Other Ambulatory Visit: Payer: Self-pay | Admitting: Family Medicine

## 2019-04-07 DIAGNOSIS — Z1231 Encounter for screening mammogram for malignant neoplasm of breast: Secondary | ICD-10-CM

## 2019-04-12 NOTE — Progress Notes (Addendum)
Subjective:   Glenda Rios is a 73 y.o. female who presents for Medicare Annual (Subsequent) preventive examination.  Review of Systems:  No ROS.  Medicare Wellness Virtual Visit.  Visual/audio telehealth visit, UTA vital signs.   See social history for additional risk factors.  Cardiac Risk Factors include: advanced age (>56men, >25 women);hypertension;family history of premature cardiovascular disease   Sleep patterns: Sleeps 6-8 hours. Sleep difficulty since increased dose of Synthroid.  Home Safety/Smoke Alarms: Feels safe in home. Smoke alarms in place.  Living environment; residence and Firearm Safety: Lives with husband in single story home, rail at steps. Son lives close.  Seat Belt Safety/Bike Helmet: Wears seat belt.   Female:   Pap-N/A       Mammo-04/07/2018, BI-RADS CATEGORY  1: Negative. [facility]  Dexa scan-03/31/2018. Will obtain records.     CCS-07/22/2016, polyp. Recall 5 years.      Objective:     Vitals: BP 113/62   Pulse 65   Ht 5' (1.524 m)   Wt 118 lb (53.5 kg)   BMI 23.05 kg/m   Body mass index is 23.05 kg/m.  Advanced Directives 04/13/2019  Does Patient Have a Medical Advance Directive? Yes  Type of Advance Directive Living will;Healthcare Power of [place] in Chart? No - copy requested    Tobacco Social History   Tobacco Use  Smoking Status Never Smoker  Smokeless Tobacco Never Used     Counseling given: Not Answered    Past Medical History:  Diagnosis Date  . Allergy   . Arthritis   . Colon polyps   . GERD (gastroesophageal reflux disease)   . History of chicken pox   . Hypertension   . Thyroid disease    Past Surgical History:  Procedure Laterality Date  . APPENDECTOMY  1995  . BACK SURGERY  2007, 2012  . CHOLECYSTECTOMY  1995   Family History  Problem Relation Age of Onset  . Breast cancer Maternal Aunt   . Hypertension Mother   . Stroke Mother   .  Cancer Mother        Bone Cancer  . Diabetes Father   . Heart disease Sister   . Alcohol abuse Sister   . Heart disease Sister   . Heart disease Sister   . Rheumatic fever Sister    Social History   Socioeconomic History  . Marital status: Married    Spouse name: Pilar Plate  . Number of children: Not on file  . Years of education: Not on file  . Highest education level: Not on file  Occupational History  . Not on file  Social Needs  . Financial resource strain: Not on file  . Food insecurity:    Worry: Not on file    Inability: Not on file  . Transportation needs:    Medical: Not on file    Non-medical: Not on file  Tobacco Use  . Smoking status: Never Smoker  . Smokeless tobacco: Never Used  Substance and Sexual Activity  . Alcohol use: Not Currently  . Drug use: Not Currently  . Sexual activity: Not on file  Lifestyle  . Physical activity:    Days per week: Not on file    Minutes per session: Not on file  . Stress: Not on file  Relationships  . Social connections:    Talks on phone: Not on file    Gets together: Not on file  Attends religious service: Not on file    Active member of club or organization: Not on file    Attends meetings of clubs or organizations: Not on file    Relationship status: Not on file  Other Topics Concern  . Not on file  Social History Narrative  . Not on file    Outpatient Encounter Medications as of 04/13/2019  Medication Sig  . ALOE PO Take 300 mg by mouth daily.  . calcium citrate-vitamin D (CITRACAL+D) 315-200 MG-UNIT tablet Take 1 tablet by mouth 2 (two) times daily.  . Coenzyme Q10 (CO Q10) 60 MG CAPS Take 1 capsule by mouth daily.  . Cranberry 600 MG TABS Take 1 tablet by mouth daily.  . Cyanocobalamin (TH VITAMIN B-12 PO) Take 1 tablet by mouth daily.  Marland Kitchen levothyroxine (SYNTHROID) 75 MCG tablet Take 1 tablet (75 mcg total) by mouth daily before breakfast.  . Lysine 500 MG CAPS Take 1 capsule by mouth daily.  Marland Kitchen omeprazole  (PRILOSEC) 40 MG capsule Take 40 mg by mouth daily.  Marland Kitchen oxybutynin (DITROPAN-XL) 10 MG 24 hr tablet Take 10 mg by mouth daily.  . Probiotic Product (PROBIOTIC-10) CAPS Take 1 capsule by mouth daily.  Marland Kitchen SALINE NASAL SPRAY NA Place into the nose 2 (two) times a day.  . Azelastine HCl 137 MCG/SPRAY SOLN Place 2 sprays into the nose 2 (two) times daily. (Patient not taking: Reported on 04/13/2019)  . guaiFENesin-codeine (ROBITUSSIN AC) 100-10 MG/5ML syrup Take 10 mLs by mouth 3 (three) times daily as needed for cough. (Patient not taking: Reported on 04/13/2019)   No facility-administered encounter medications on file as of 04/13/2019.     Activities of Daily Living In your present state of health, do you have any difficulty performing the following activities: 04/13/2019 12/28/2018  Hearing? N N  Vision? N N  Difficulty concentrating or making decisions? N N  Walking or climbing stairs? N N  Dressing or bathing? N N  Doing errands, shopping? N N  Preparing Food and eating ? N -  Using the Toilet? N -  In the past six months, have you accidently leaked urine? N -  Do you have problems with loss of bowel control? N -  Managing your Medications? N -  Managing your Finances? N -  Housekeeping or managing your Housekeeping? N -  Some recent data might be hidden    Patient Care Team: Midge Minium, MD as PCP - General (Family Medicine) Wonda Horner, MD as Consulting Physician (Gastroenterology) Luberta Mutter, MD as Consulting Physician (Ophthalmology) Gavin Pound, MD as Consulting Physician (Rheumatology) Thurnell Lose, MD as Consulting Physician (Obstetrics and Gynecology) Franchot Gallo, MD as Consulting Physician (Urology)    Assessment:   This is a routine wellness examination for Glenda Rios.  Exercise Activities and Dietary recommendations Current Exercise Habits: Home exercise routine, Type of exercise: walking(stationary bike), Frequency (Times/Week): 4, Exercise limited  by: None identified   Diet (meal preparation, eat out, water intake, caffeinated beverages, dairy products, fruits and vegetables): Drinks coffee and water. Avoids sodas and juices.   Eats 3 meals/day, light lunch. Heart healthy.   Goals    . Patient Stated     Maintain current health by staying active.        Fall Risk Fall Risk  04/13/2019 12/28/2018  Falls in the past year? 0 0  Number falls in past yr: 0 0  Injury with Fall? - 0    Depression Screen PHQ 2/9 Scores 04/13/2019  03/02/2019 12/28/2018 08/23/2018  PHQ - 2 Score 0 0 0 0  PHQ- 9 Score - - 0 0     Cognitive Function MMSE - Mini Mental State Exam 04/13/2019  Not completed: Unable to complete   Telephone visit. Pt reports no issues with memory. Able to recall 3 words.       Immunization History  Administered Date(s) Administered  . H1N1 08/30/2017  . Influenza, High Dose Seasonal PF 08/31/2007  . Influenza,inj,Quad PF,6+ Mos 07/28/2018  . Pneumococcal Conjugate-13 01/15/2015  . Pneumococcal Polysaccharide-23 02/17/2017  . Tdap 08/31/2007     Screening Tests Health Maintenance  Topic Date Due  . TETANUS/TDAP  12/29/2019 (Originally 08/30/2017)  . Hepatitis C Screening  12/29/2019 (Originally 08-18-1946)  . INFLUENZA VACCINE  06/10/2019  . MAMMOGRAM  04/07/2020  . COLONOSCOPY  07/22/2021  . DEXA SCAN  Completed  . PNA vac Low Risk Adult  Completed   Declines Shingrix at this time.      Plan:    Your DEXA (bone scan) was performed at [facility] on 03/31/2018, so you're up to date.   Continue doing brain stimulating activities (puzzles, reading, adult coloring books, staying active) to keep memory sharp.   Bring a copy of your living will and/or healthcare power of attorney to your next office visit.  I have personally reviewed and noted the following in the patient's chart:   . Medical and social history . Use of alcohol, tobacco or illicit drugs  . Current medications and supplements . Functional  ability and status . Nutritional status . Physical activity . Advanced directives . List of other physicians . Hospitalizations, surgeries, and ER visits in previous 12 months . Vitals . Screenings to include cognitive, depression, and falls . Referrals and appointments  In addition, I have reviewed and discussed with patient certain preventive protocols, quality metrics, and best practice recommendations. A written personalized care plan for preventive services as well as general preventive health recommendations were provided to patient.     Gerilyn Nestle, RN  04/13/2019  PCP Notes: -Pt has started taking Aloe supplement, replacing omeprazole.  -Would like Hep C screening at next blood draw (ordered) -Reports sleep interruption since increasing levothyroxine dosage.  -Lab appt 04/17/2019 to recheck TSH.

## 2019-04-13 ENCOUNTER — Ambulatory Visit (INDEPENDENT_AMBULATORY_CARE_PROVIDER_SITE_OTHER): Payer: Medicare Other

## 2019-04-13 ENCOUNTER — Other Ambulatory Visit: Payer: Self-pay

## 2019-04-13 ENCOUNTER — Ambulatory Visit: Payer: Medicare Other

## 2019-04-13 VITALS — BP 113/62 | HR 65 | Ht 60.0 in | Wt 118.0 lb

## 2019-04-13 DIAGNOSIS — Z1159 Encounter for screening for other viral diseases: Secondary | ICD-10-CM | POA: Diagnosis not present

## 2019-04-13 DIAGNOSIS — E039 Hypothyroidism, unspecified: Secondary | ICD-10-CM

## 2019-04-13 DIAGNOSIS — Z0000 Encounter for general adult medical examination without abnormal findings: Secondary | ICD-10-CM | POA: Diagnosis not present

## 2019-04-13 NOTE — Progress Notes (Addendum)
  I connected with patient 04/13/19 at 10:00 AM EDT by a video/audio enabled telemedicine application and verified that I am speaking with the correct person using two identifiers. Patient stated full name and DOB. Patient gave permission to continue with virtual visit. Patient's location was at home and Nurse's location was at [place] office.    Reviewed documentation provided by RN and agree w/ information/advice given.  Reviewed notes for PCP and will discuss with pt at upcoming visits.  Annye Asa, MD

## 2019-04-13 NOTE — Patient Instructions (Addendum)
Your DEXA (bone scan) was performed at [facility] on 03/31/2018, so you're up to date.   Continue doing brain stimulating activities (puzzles, reading, adult coloring books, staying active) to keep memory sharp.   Bring a copy of your living will and/or healthcare power of attorney to your next office visit.   Fall Prevention in the Home, Adult Falls can cause injuries. They can happen to people of all ages. There are many things you can do to make your home safe and to help prevent falls. Ask for help when making these changes, if needed. What actions can I take to prevent falls? General Instructions  Use good lighting in all rooms. Replace any light bulbs that burn out.  Turn on the lights when you go into a dark area. Use night-lights.  Keep items that you use often in easy-to-reach places. Lower the shelves around your home if necessary.  Set up your furniture so you have a clear path. Avoid moving your furniture around.  Do not have throw rugs and other things on the floor that can make you trip.  Avoid walking on wet floors.  If any of your floors are uneven, fix them.  Add color or contrast paint or tape to clearly mark and help you see: ? Any grab bars or handrails. ? First and last steps of stairways. ? Where the edge of each step is.  If you use a stepladder: ? Make sure that it is fully opened. Do not climb a closed stepladder. ? Make sure that both sides of the stepladder are locked into place. ? Ask someone to hold the stepladder for you while you use it.  If there are any pets around you, be aware of where they are. What can I do in the bathroom?      Keep the floor dry. Clean up any water that spills onto the floor as soon as it happens.  Remove soap buildup in the tub or shower regularly.  Use non-skid mats or decals on the floor of the tub or shower.  Attach bath mats securely with double-sided, non-slip rug tape.  If you need to sit down in the shower,  use a plastic, non-slip stool.  Install grab bars by the toilet and in the tub and shower. Do not use towel bars as grab bars. What can I do in the bedroom?  Make sure that you have a light by your bed that is easy to reach.  Do not use any sheets or blankets that are too big for your bed. They should not hang down onto the floor.  Have a firm chair that has side arms. You can use this for support while you get dressed. What can I do in the kitchen?  Clean up any spills right away.  If you need to reach something above you, use a strong step stool that has a grab bar.  Keep electrical cords out of the way.  Do not use floor polish or wax that makes floors slippery. If you must use wax, use non-skid floor wax. What can I do with my stairs?  Do not leave any items on the stairs.  Make sure that you have a light switch at the top of the stairs and the bottom of the stairs. If you do not have them, ask someone to add them for you.  Make sure that there are handrails on both sides of the stairs, and use them. Fix handrails that are broken or loose.  Make sure that handrails are as long as the stairways.  Install non-slip stair treads on all stairs in your home.  Avoid having throw rugs at the top or bottom of the stairs. If you do have throw rugs, attach them to the floor with carpet tape.  Choose a carpet that does not hide the edge of the steps on the stairway.  Check any carpeting to make sure that it is firmly attached to the stairs. Fix any carpet that is loose or worn. What can I do on the outside of my home?  Use bright outdoor lighting.  Regularly fix the edges of walkways and driveways and fix any cracks.  Remove anything that might make you trip as you walk through a door, such as a raised step or threshold.  Trim any bushes or trees on the path to your home.  Regularly check to see if handrails are loose or broken. Make sure that both sides of any steps have  handrails.  Install guardrails along the edges of any raised decks and porches.  Clear walking paths of anything that might make someone trip, such as tools or rocks.  Have any leaves, snow, or ice cleared regularly.  Use sand or salt on walking paths during winter.  Clean up any spills in your garage right away. This includes grease or oil spills. What other actions can I take?  Wear shoes that: ? Have a low heel. Do not wear high heels. ? Have rubber bottoms. ? Are comfortable and fit you well. ? Are closed at the toe. Do not wear open-toe sandals.  Use tools that help you move around (mobility aids) if they are needed. These include: ? Canes. ? Walkers. ? Scooters. ? Crutches.  Review your medicines with your doctor. Some medicines can make you feel dizzy. This can increase your chance of falling. Ask your doctor what other things you can do to help prevent falls. Where to find more information  Centers for Disease Control and Prevention, STEADI: https://garcia.biz/  [employer] on Aging: BrainJudge.co.uk Contact a doctor if:  You are afraid of falling at home.  You feel weak, drowsy, or dizzy at home.  You fall at home. Summary  There are many simple things that you can do to make your home safe and to help prevent falls.  Ways to make your home safe include removing tripping hazards and installing grab bars in the bathroom.  Ask for help when making these changes in your home. This information is not intended to replace advice given to you by your health care provider. Make sure you discuss any questions you have with your health care provider. Document Released: 08/22/2009 Document Revised: 06/10/2017 Document Reviewed: 06/10/2017 Elsevier Interactive Patient Education  2019 [place] Maintenance, Female Adopting a healthy lifestyle and getting preventive care can go a long way to promote health and wellness. Talk with your health  care provider about what schedule of regular examinations is right for you. This is a good chance for you to check in with your provider about disease prevention and staying healthy. In between checkups, there are plenty of things you can do on your own. Experts have done a lot of research about which lifestyle changes and preventive measures are most likely to keep you healthy. Ask your health care provider for more information. Weight and diet Eat a healthy diet  Be sure to include plenty of vegetables, fruits, low-fat dairy products, and lean protein.  Do  not eat a lot of foods high in solid fats, added sugars, or salt.  Get regular exercise. This is one of the most important things you can do for your health. ? Most adults should exercise for at least 150 minutes each week. The exercise should increase your heart rate and make you sweat (moderate-intensity exercise). ? Most adults should also do strengthening exercises at least twice a week. This is in addition to the moderate-intensity exercise. Maintain a healthy weight  Body mass index (BMI) is a measurement that can be used to identify possible weight problems. It estimates body fat based on height and weight. Your health care provider can help determine your BMI and help you achieve or maintain a healthy weight.  For females 27 years of age and older: ? A BMI below 18.5 is considered underweight. ? A BMI of 18.5 to 24.9 is normal. ? A BMI of 25 to 29.9 is considered overweight. ? A BMI of 30 and above is considered obese. Watch levels of cholesterol and blood lipids  You should start having your blood tested for lipids and cholesterol at 71 years of age, then have this test every 5 years.  You may need to have your cholesterol levels checked more often if: ? Your lipid or cholesterol levels are high. ? You are older than 71 years of age. ? You are at high risk for heart disease. Cancer screening Lung Cancer  Lung cancer  screening is recommended for adults 85-52 years old who are at high risk for lung cancer because of a history of smoking.  A yearly low-dose CT scan of the lungs is recommended for people who: ? Currently smoke. ? Have quit within the past 15 years. ? Have at least a 30-pack-year history of smoking. A pack year is smoking an average of one pack of cigarettes a day for 1 year.  Yearly screening should continue until it has been 15 years since you quit.  Yearly screening should stop if you develop a health problem that would prevent you from having lung cancer treatment. Breast Cancer  Practice breast self-awareness. This means understanding how your breasts normally appear and feel.  It also means doing regular breast self-exams. Let your health care provider know about any changes, no matter how small.  If you are in your 20s or 30s, you should have a clinical breast exam (CBE) by a health care provider every 1-3 years as part of a regular health exam.  If you are 41 or older, have a CBE every year. Also consider having a breast X-ray (mammogram) every year.  If you have a family history of breast cancer, talk to your health care provider about genetic screening.  If you are at high risk for breast cancer, talk to your health care provider about having an MRI and a mammogram every year.  Breast cancer gene (BRCA) assessment is recommended for women who have family members with BRCA-related cancers. BRCA-related cancers include: ? Breast. ? Ovarian. ? Tubal. ? Peritoneal cancers.  Results of the assessment will determine the need for genetic counseling and BRCA1 and BRCA2 testing. Cervical Cancer Your health care provider may recommend that you be screened regularly for cancer of the pelvic organs (ovaries, uterus, and vagina). This screening involves a pelvic examination, including checking for microscopic changes to the surface of your cervix (Pap test). You may be encouraged to have  this screening done every 3 years, beginning at age 63.  For women ages  30-65, health care providers may recommend pelvic exams and Pap testing every 3 years, or they may recommend the Pap and pelvic exam, combined with testing for human papilloma virus (HPV), every 5 years. Some types of HPV increase your risk of cervical cancer. Testing for HPV may also be done on women of any age with unclear Pap test results.  Other health care providers may not recommend any screening for nonpregnant women who are considered low risk for pelvic cancer and who do not have symptoms. Ask your health care provider if a screening pelvic exam is right for you.  If you have had past treatment for cervical cancer or a condition that could lead to cancer, you need Pap tests and screening for cancer for at least 20 years after your treatment. If Pap tests have been discontinued, your risk factors (such as having a new sexual partner) need to be reassessed to determine if screening should resume. Some women have medical problems that increase the chance of getting cervical cancer. In these cases, your health care provider may recommend more frequent screening and Pap tests. Colorectal Cancer  This type of cancer can be detected and often prevented.  Routine colorectal cancer screening usually begins at 71 years of age and continues through 71 years of age.  Your health care provider may recommend screening at an earlier age if you have risk factors for colon cancer.  Your health care provider may also recommend using home test kits to check for hidden blood in the stool.  A small camera at the end of a tube can be used to examine your colon directly (sigmoidoscopy or colonoscopy). This is done to check for the earliest forms of colorectal cancer.  Routine screening usually begins at age 40.  Direct examination of the colon should be repeated every 5-10 years through 71 years of age. However, you may need to be  screened more often if early forms of precancerous polyps or small growths are found. Skin Cancer  Check your skin from head to toe regularly.  Tell your health care provider about any new moles or changes in moles, especially if there is a change in a mole's shape or color.  Also tell your health care provider if you have a mole that is larger than the size of a pencil eraser.  Always use sunscreen. Apply sunscreen liberally and repeatedly throughout the day.  Protect yourself by wearing long sleeves, pants, a wide-brimmed hat, and sunglasses whenever you are outside. Heart disease, diabetes, and high blood pressure  High blood pressure causes heart disease and increases the risk of stroke. High blood pressure is more likely to develop in: ? People who have blood pressure in the high end of the normal range (130-139/85-89 mm Hg). ? People who are overweight or obese. ? People who are African American.  If you are 35-43 years of age, have your blood pressure checked every 3-5 years. If you are 47 years of age or older, have your blood pressure checked every year. You should have your blood pressure measured twice-once when you are at a hospital or clinic, and once when you are not at a hospital or clinic. Record the average of the two measurements. To check your blood pressure when you are not at a hospital or clinic, you can use: ? An automated blood pressure machine at a pharmacy. ? A home blood pressure monitor.  If you are between 21 years and 55 years old, ask your health  care provider if you should take aspirin to prevent strokes.  Have regular diabetes screenings. This involves taking a blood sample to check your fasting blood sugar level. ? If you are at a normal weight and have a low risk for diabetes, have this test once every three years after 71 years of age. ? If you are overweight and have a high risk for diabetes, consider being tested at a younger age or more often.  Preventing infection Hepatitis B  If you have a higher risk for hepatitis B, you should be screened for this virus. You are considered at high risk for hepatitis B if: ? You were born in a country where hepatitis B is common. Ask your health care provider which countries are considered high risk. ? Your parents were born in a high-risk country, and you have not been immunized against hepatitis B (hepatitis B vaccine). ? You have HIV or AIDS. ? You use needles to inject street drugs. ? You live with someone who has hepatitis B. ? You have had sex with someone who has hepatitis B. ? You get hemodialysis treatment. ? You take certain medicines for conditions, including cancer, organ transplantation, and autoimmune conditions. Hepatitis C  Blood testing is recommended for: ? Everyone born from [birth year] through 1963. ? Anyone with known risk factors for hepatitis C. Sexually transmitted infections (STIs)  You should be screened for sexually transmitted infections (STIs) including gonorrhea and chlamydia if: ? You are sexually active and are younger than 71 years of age. ? You are older than 71 years of age and your health care provider tells you that you are at risk for this type of infection. ? Your sexual activity has changed since you were last screened and you are at an increased risk for chlamydia or gonorrhea. Ask your health care provider if you are at risk.  If you do not have HIV, but are at risk, it may be recommended that you take a prescription medicine daily to prevent HIV infection. This is called pre-exposure prophylaxis (PrEP). You are considered at risk if: ? You are sexually active and do not regularly use condoms or know the HIV status of your partner(s). ? You take drugs by injection. ? You are sexually active with a partner who has HIV. Talk with your health care provider about whether you are at high risk of being infected with HIV. If you choose to begin PrEP, you should  first be tested for HIV. You should then be tested every 3 months for as long as you are taking PrEP. Pregnancy  If you are premenopausal and you may become pregnant, ask your health care provider about preconception counseling.  If you may become pregnant, take 400 to 800 micrograms (mcg) of folic acid every day.  If you want to prevent pregnancy, talk to your health care provider about birth control (contraception). Osteoporosis and menopause  Osteoporosis is a disease in which the bones lose minerals and strength with aging. This can result in serious bone fractures. Your risk for osteoporosis can be identified using a bone density scan.  If you are 16 years of age or older, or if you are at risk for osteoporosis and fractures, ask your health care provider if you should be screened.  Ask your health care provider whether you should take a calcium or vitamin D supplement to lower your risk for osteoporosis.  Menopause may have certain physical symptoms and risks.  Hormone replacement therapy may reduce some  of these symptoms and risks. Talk to your health care provider about whether hormone replacement therapy is right for you. Follow these instructions at home:  Schedule regular health, dental, and eye exams.  Stay current with your immunizations.  Do not use any tobacco products including cigarettes, chewing tobacco, or electronic cigarettes.  If you are pregnant, do not drink alcohol.  If you are breastfeeding, limit how much and how often you drink alcohol.  Limit alcohol intake to no more than 1 drink per day for nonpregnant women. One drink equals 12 ounces of beer, 5 ounces of wine, or 1 ounces of hard liquor.  Do not use street drugs.  Do not share needles.  Ask your health care provider for help if you need support or information about quitting drugs.  Tell your health care provider if you often feel depressed.  Tell your health care provider if you have ever  been abused or do not feel safe at home. This information is not intended to replace advice given to you by your health care provider. Make sure you discuss any questions you have with your health care provider. Document Released: 05/11/2011 Document Revised: 04/02/2016 Document Reviewed: 07/30/2015 Elsevier Interactive Patient Education  2019 [employer].

## 2019-04-17 ENCOUNTER — Other Ambulatory Visit: Payer: Self-pay

## 2019-04-17 ENCOUNTER — Ambulatory Visit (INDEPENDENT_AMBULATORY_CARE_PROVIDER_SITE_OTHER): Payer: Medicare Other

## 2019-04-17 DIAGNOSIS — E039 Hypothyroidism, unspecified: Secondary | ICD-10-CM | POA: Diagnosis not present

## 2019-04-17 LAB — TSH: TSH: 0.98 u[IU]/mL (ref 0.35–4.50)

## 2019-04-18 ENCOUNTER — Encounter: Payer: Self-pay | Admitting: General Practice

## 2019-05-02 ENCOUNTER — Encounter: Payer: Self-pay | Admitting: General Practice

## 2019-05-16 ENCOUNTER — Other Ambulatory Visit: Payer: Self-pay | Admitting: Family Medicine

## 2019-05-16 DIAGNOSIS — E039 Hypothyroidism, unspecified: Secondary | ICD-10-CM

## 2019-05-23 ENCOUNTER — Other Ambulatory Visit: Payer: Self-pay | Admitting: General Practice

## 2019-05-23 ENCOUNTER — Telehealth: Payer: Self-pay | Admitting: Family Medicine

## 2019-05-23 DIAGNOSIS — E039 Hypothyroidism, unspecified: Secondary | ICD-10-CM

## 2019-05-23 MED ORDER — OMEPRAZOLE 40 MG PO CPDR: 40.0000 mg | DELAYED_RELEASE_CAPSULE | Freq: Every day | ORAL | 1 refills | Status: DC

## 2019-05-23 MED ORDER — LEVOTHYROXINE SODIUM 50 MCG PO TABS: 50.0000 ug | ORAL_TABLET | Freq: Every day | ORAL | 3 refills | Status: DC

## 2019-05-23 NOTE — Addendum Note (Signed)
Addended by: Davis Gourd on: 05/23/2019 01:24 PM   Modules accepted: Orders

## 2019-05-23 NOTE — Addendum Note (Signed)
Addended by: Davis Gourd on: 05/23/2019 01:27 PM   Modules accepted: Orders

## 2019-05-23 NOTE — Telephone Encounter (Signed)
Decrease thyroid med to 75mcg daily and repeat TSH in 1 month at lab only visit

## 2019-05-23 NOTE — Addendum Note (Signed)
Addended by: Davis Gourd on: 05/23/2019 01:25 PM   Modules accepted: Orders

## 2019-05-23 NOTE — Telephone Encounter (Signed)
Called and advised pt, she has old levothyroxine 39mcg at home she will begin taking. Labs were ordered but she will have to call back to schedule a lab appointment depending on her husbands appt schedule.

## 2019-05-23 NOTE — Telephone Encounter (Signed)
Please advise, pt has been on this dose since April. Repeat TSH in June showed labs were now normal

## 2019-05-23 NOTE — Telephone Encounter (Signed)
Pt called in stating that she has been unable to sleep and has lost more weight and is now weights 115lbs. She wanted to know if her Euthyrox 75MCG could cause this. Pt can be reached at the cell # today.

## 2019-05-25 ENCOUNTER — Ambulatory Visit: Payer: Medicare Other

## 2019-06-20 ENCOUNTER — Ambulatory Visit: Payer: Medicare Other

## 2019-06-22 ENCOUNTER — Other Ambulatory Visit: Payer: Self-pay

## 2019-06-22 ENCOUNTER — Ambulatory Visit (INDEPENDENT_AMBULATORY_CARE_PROVIDER_SITE_OTHER): Payer: Medicare Other

## 2019-06-22 DIAGNOSIS — E039 Hypothyroidism, unspecified: Secondary | ICD-10-CM

## 2019-06-23 LAB — TSH: TSH: 2.18 u[IU]/mL (ref 0.35–4.50)

## 2019-06-29 ENCOUNTER — Other Ambulatory Visit: Payer: Self-pay | Admitting: *Deleted

## 2019-06-29 MED ORDER — LEVOTHYROXINE SODIUM 50 MCG PO TABS: 50.0000 ug | ORAL_TABLET | Freq: Every day | ORAL | 1 refills | Status: DC

## 2019-07-04 ENCOUNTER — Ambulatory Visit
Admission: RE | Admit: 2019-07-04 | Discharge: 2019-07-04 | Disposition: A | Discharge status: Home / Self Care | Payer: Medicare Other | Source: Ambulatory Visit | Attending: Family Medicine | Admitting: Family Medicine

## 2019-07-04 ENCOUNTER — Other Ambulatory Visit: Payer: Self-pay

## 2019-07-04 DIAGNOSIS — Z1231 Encounter for screening mammogram for malignant neoplasm of breast: Secondary | ICD-10-CM

## 2019-08-21 DIAGNOSIS — H35371 Puckering of macula, right eye: Secondary | ICD-10-CM | POA: Diagnosis not present

## 2019-08-24 ENCOUNTER — Other Ambulatory Visit: Payer: Self-pay

## 2019-08-24 ENCOUNTER — Encounter: Payer: Self-pay | Admitting: Family Medicine

## 2019-08-24 ENCOUNTER — Ambulatory Visit (INDEPENDENT_AMBULATORY_CARE_PROVIDER_SITE_OTHER): Payer: Medicare Other | Admitting: Family Medicine

## 2019-08-24 VITALS — BP 136/82 | HR 76 | Temp 98.0°F | Resp 17 | Ht 60.0 in | Wt 109.5 lb

## 2019-08-24 DIAGNOSIS — M1990 Unspecified osteoarthritis, unspecified site: Secondary | ICD-10-CM | POA: Insufficient documentation

## 2019-08-24 DIAGNOSIS — E079 Disorder of thyroid, unspecified: Secondary | ICD-10-CM

## 2019-08-24 DIAGNOSIS — Z23 Encounter for immunization: Secondary | ICD-10-CM | POA: Diagnosis not present

## 2019-08-24 DIAGNOSIS — M19011 Primary osteoarthritis, right shoulder: Secondary | ICD-10-CM

## 2019-08-24 DIAGNOSIS — L821 Other seborrheic keratosis: Secondary | ICD-10-CM | POA: Diagnosis not present

## 2019-08-24 DIAGNOSIS — M19012 Primary osteoarthritis, left shoulder: Secondary | ICD-10-CM | POA: Diagnosis not present

## 2019-08-24 MED ORDER — TIZANIDINE HCL 4 MG PO TABS: 4.0000 mg | ORAL_TABLET | Freq: Three times a day (TID) | ORAL | 1 refills | Status: DC | PRN

## 2019-08-24 MED ORDER — CELECOXIB 100 MG PO CAPS: 100.0000 mg | ORAL_CAPSULE | Freq: Two times a day (BID) | ORAL | 1 refills | Status: DC

## 2019-08-24 NOTE — Assessment & Plan Note (Signed)
Deteriorated.  Pt reports bilateral shoulder pain and now neck pain.  This could be due to her bilateral trap spasm vs degenerative changes.  She has seen rheum and had cortisone injxn w/ minimal relief.  Start Tizanidine for spasm, Celebrex for inflammation.  Reviewed supportive care and red flags that should prompt return.  Pt expressed understanding and is in agreement w/ plan.

## 2019-08-24 NOTE — Patient Instructions (Signed)
Follow up as needed or as scheduled We'll notify you of your lab results and make any changes if needed Start the Tizanidine as needed for muscle spasm and tightness Start the Celebrex twice daily w/ food.  If for some reason this is too expensive, let us know and we'll see if we can do a prior authorization Call with any questions or concerns Hang in there!

## 2019-08-24 NOTE — Assessment & Plan Note (Signed)
Pt reports sxs of hyperthyroid- racing heart, weight loss, increased anxiety.  She admits this could all be stress related as husband has metastatic cancer and decided to stop treatment.  Check labs.  Adjust meds prn

## 2019-08-24 NOTE — Progress Notes (Signed)
   Subjective:    Patient ID: Glenda Rios, female    DOB: 15-Aug-1946, 73 y.o.   MRN: [id number]  HPI Hypothyroid- pt reports feeling 'really revved up' at night when she is trying to lie down and rest.  Some racing heart rate.  Has 8 lb weight loss.  Mole- pt has a mole on her L side that she is concerned about.  Denies major changes.  1 has 'a dark spot'  Arthritis- pt reports bilateral shoulder pain that now extends into her neck.  Is using topical biofreeze multiple times/day.  Some relief w/ tylenol arthritis, massage.  Admits to being very tense.  Had cortisone shot from Rheum in R shoulder- only lasted 3 weeks.  Pt has been avoiding NSAIDs due to GERD.   Review of Systems For ROS see HPI     Objective:   Physical Exam Vitals signs reviewed.  Constitutional:      General: She is not in acute distress.    Appearance: Normal appearance. She is well-developed.  HENT:     Head: Normocephalic and atraumatic.  Eyes:     Conjunctiva/sclera: Conjunctivae normal.     Pupils: Pupils are equal, round, and reactive to light.  Neck:     Musculoskeletal: Normal range of motion and neck supple.     Thyroid: No thyromegaly.  Cardiovascular:     Rate and Rhythm: Normal rate and regular rhythm.     Heart sounds: Normal heart sounds. No murmur.  Pulmonary:     Effort: Pulmonary effort is normal. No respiratory distress.     Breath sounds: Normal breath sounds.  Abdominal:     General: There is no distension.     Palpations: Abdomen is soft.     Tenderness: There is no abdominal tenderness.  Musculoskeletal:     Comments: Bilateral trap spasm Full ROM of neck  Lymphadenopathy:     Cervical: No cervical adenopathy.  Skin:    General: Skin is warm and dry.     Comments: Scattered SKs on L flank- all benign appearing  Neurological:     Mental Status: She is alert and oriented to person, place, and time.  Psychiatric:        Behavior: Behavior normal.           Assessment &  Plan:

## 2019-08-24 NOTE — Assessment & Plan Note (Signed)
Scattered over L flank.  All are benign in appearance.  Reassurance provided.

## 2019-08-25 ENCOUNTER — Telehealth: Payer: Self-pay | Admitting: *Deleted

## 2019-08-25 LAB — TSH: TSH: 1.52 u[IU]/mL (ref 0.35–4.50)

## 2019-08-25 NOTE — Telephone Encounter (Signed)
Any suggestions>?

## 2019-08-25 NOTE — Telephone Encounter (Signed)
Patient called in regards to the celebrex - it will cost her about $100 a month.  She said that if she uses a coupon then she can't use her $100.  She states that she cannot pay that price and would like to know what can be done.

## 2019-08-25 NOTE — Telephone Encounter (Signed)
Pt needs to go to goodrx.com and enter Celebrex into the search bar to see which pharmacies have the lowest price and to get the free GoodRx coupon.  [facility] has it for $8.15.  She just needs to let us know where to send the script

## 2019-08-25 NOTE — Telephone Encounter (Signed)
Pt notified. Said she will look into pharmacies and will send a mychart to inform where to send meds.

## 2019-08-30 ENCOUNTER — Telehealth: Payer: Self-pay

## 2019-08-30 ENCOUNTER — Other Ambulatory Visit: Payer: Self-pay

## 2019-08-30 MED ORDER — CELECOXIB 100 MG PO CAPS: 100.0000 mg | ORAL_CAPSULE | Freq: Two times a day (BID) | ORAL | 1 refills | Status: DC

## 2019-08-30 NOTE — Addendum Note (Signed)
Addended by: Davis Gourd on: 08/30/2019 01:13 PM   Modules accepted: Orders

## 2019-08-30 NOTE — Telephone Encounter (Signed)
Rx printed and faxed.  

## 2019-08-30 NOTE — Telephone Encounter (Signed)
Please send to requested pharmacy

## 2019-08-30 NOTE — Telephone Encounter (Signed)
Ok to send in Rx?  

## 2019-08-30 NOTE — Addendum Note (Signed)
Addended by: Davis Gourd on: 08/30/2019 02:46 PM   Modules accepted: Orders

## 2019-08-30 NOTE — Telephone Encounter (Signed)
Patient called in stating that the best monthly co pay for her Celebrex is thru [employer]. Requesting that script sent to them.   Fax # [phone]

## 2019-09-18 ENCOUNTER — Telehealth: Payer: Self-pay | Admitting: Family Medicine

## 2019-09-18 NOTE — Telephone Encounter (Signed)
Called and spoke with pt. She states that she is going to stop the celebrex. States that it is causing her to have ringing in her ears, elevated BP, and she has had to restart her omeprazole. She is wanting to know if it is ok to take OTC voltaren instead?   Also the tizanidine is making her itch. So she is only taking it at night with benadryl.

## 2019-09-18 NOTE — Telephone Encounter (Signed)
Pt called in wanting to talk with someone about her arthritis medication. Pt can be reached at the cell #

## 2019-09-19 NOTE — Telephone Encounter (Signed)
Stop Celebrex.  OTC Voltaren gel would be a great option

## 2019-09-19 NOTE — Telephone Encounter (Signed)
Pt informed

## 2019-12-14 DIAGNOSIS — Z23 Encounter for immunization: Secondary | ICD-10-CM | POA: Diagnosis not present

## 2019-12-26 ENCOUNTER — Other Ambulatory Visit: Payer: Self-pay | Admitting: Family Medicine

## 2020-01-12 DIAGNOSIS — Z23 Encounter for immunization: Secondary | ICD-10-CM | POA: Diagnosis not present

## 2020-03-11 ENCOUNTER — Other Ambulatory Visit: Payer: Self-pay | Admitting: Family Medicine

## 2020-03-25 ENCOUNTER — Other Ambulatory Visit: Payer: Self-pay | Admitting: Family Medicine

## 2020-05-03 ENCOUNTER — Telehealth: Payer: Self-pay | Admitting: Family Medicine

## 2020-05-03 MED ORDER — AMLODIPINE BESYLATE 5 MG PO TABS: ORAL_TABLET | ORAL | 1 refills | Status: DC

## 2020-05-03 NOTE — Telephone Encounter (Signed)
Please advise 

## 2020-05-03 NOTE — Telephone Encounter (Signed)
Ok to refill meds and please extend our thoughts and best wishes with all that she has going on

## 2020-05-03 NOTE — Telephone Encounter (Signed)
Called and advised pt that this had been sent in. She is coming in for an appt in 4-6 weeks for a follow up.

## 2020-05-03 NOTE — Telephone Encounter (Signed)
Would like her blood pressure medicine refilled - Would like it called into  [facility] - patient states that she is having to take her blood pressure medicine again because her husband being so sick is putting stress on her and she is unable to exercise like she was before - she also said that her husband wants you to know that he has received his hospital bed and is so happy with it.

## 2020-05-14 ENCOUNTER — Other Ambulatory Visit: Payer: Self-pay

## 2020-05-14 ENCOUNTER — Ambulatory Visit (INDEPENDENT_AMBULATORY_CARE_PROVIDER_SITE_OTHER): Payer: Medicare Other

## 2020-05-14 VITALS — Ht 60.0 in | Wt 105.6 lb

## 2020-05-14 DIAGNOSIS — Z0000 Encounter for general adult medical examination without abnormal findings: Secondary | ICD-10-CM

## 2020-05-14 NOTE — Patient Instructions (Signed)
Glenda Rios , Thank you for taking time to come for your Medicare Wellness Visit. I appreciate your ongoing commitment to your health goals. Please review the following plan we discussed and let me know if I can assist you in the future.   Screening recommendations/referrals: Colonoscopy: Completed 07/22/2016-Due 07/22/2021 Recommended yearly ophthalmology/optometry visit for glaucoma screening and checkup Recommended yearly dental visit for hygiene and checkup  Vaccinations: Influenza vaccine: Up to date-Due-07/2020 Pneumococcal vaccine: Completed vaccines Tdap vaccine: Discuss with pharmacy Shingles vaccine: Discuss with pharmacy   Covid-19: Completed vaccines  Advanced directives: Bring a copy to next office visit  Conditions/risks identified: See problem list. Keep upcoming appointment with Dr. Birdie Riddle regarding weight loss & thyroid medication.  Next appointment: Follow up in one year for your annual wellness visit.   Preventive Care 72 Years and Older, Female Preventive care refers to lifestyle choices and visits with your health care provider that can promote health and wellness. What does preventive care include?  A yearly physical exam. This is also called an annual well check.  Dental exams once or twice a year.  Routine eye exams. Ask your health care provider how often you should have your eyes checked.  Personal lifestyle choices, including:  Daily care of your teeth and gums.  Regular physical activity.  Eating a healthy diet.  Avoiding tobacco and drug use.  Limiting alcohol use.  Practicing safe sex.  Taking low doses of aspirin every day.  Taking vitamin and mineral supplements as recommended by your health care provider. What happens during an annual well check? The services and screenings done by your health care provider during your annual well check will depend on your age, overall health, lifestyle risk factors, and family history of  disease. Counseling  Your health care provider may ask you questions about your:  Alcohol use.  Tobacco use.  Drug use.  Emotional well-being.  Home and relationship well-being.  Sexual activity.  Eating habits.  History of falls.  Memory and ability to understand (cognition).  Work and work Statistician. Screening  You may have the following tests or measurements:  Height, weight, and BMI.  Blood pressure.  Lipid and cholesterol levels. These may be checked every 5 years, or more frequently if you are over 72 years old.  Skin check.  Lung cancer screening. You may have this screening every year starting at age 72 if you have a 30-pack-year history of smoking and currently smoke or have quit within the past 15 years.  Fecal occult blood test (FOBT) of the stool. You may have this test every year starting at age 72.  Flexible sigmoidoscopy or colonoscopy. You may have a sigmoidoscopy every 5 years or a colonoscopy every 10 years starting at age 72.  Prostate cancer screening. Recommendations will vary depending on your family history and other risks.  Hepatitis C blood test.  Hepatitis B blood test.  Sexually transmitted disease (STD) testing.  Diabetes screening. This is done by checking your blood sugar (glucose) after you have not eaten for a while (fasting). You may have this done every 1-3 years.  Abdominal aortic aneurysm (AAA) screening. You may need this if you are a current or former smoker.  Osteoporosis. You may be screened starting at age 72 if you are at high risk. Talk with your health care provider about your test results, treatment options, and if necessary, the need for more tests. Vaccines  Your health care provider may recommend certain vaccines, such as:  Influenza  vaccine. This is recommended every year.  Tetanus, diphtheria, and acellular pertussis (Tdap, Td) vaccine. You may need a Td booster every 10 years.  Zoster vaccine. You may  need this after age 72.  Pneumococcal 13-valent conjugate (PCV13) vaccine. One dose is recommended after age 72.  Pneumococcal polysaccharide (PPSV23) vaccine. One dose is recommended after age 72. Talk to your health care provider about which screenings and vaccines you need and how often you need them. This information is not intended to replace advice given to you by your health care provider. Make sure you discuss any questions you have with your health care provider. Document Released: 11/22/2015 Document Revised: 07/15/2016 Document Reviewed: 08/27/2015 Elsevier Interactive Patient Education  2017 [place] Prevention in the Home Falls can cause injuries. They can happen to people of all ages. There are many things you can do to make your home safe and to help prevent falls. What can I do on the outside of my home?  Regularly fix the edges of walkways and driveways and fix any cracks.  Remove anything that might make you trip as you walk through a door, such as a raised step or threshold.  Trim any bushes or trees on the path to your home.  Use bright outdoor lighting.  Clear any walking paths of anything that might make someone trip, such as rocks or tools.  Regularly check to see if handrails are loose or broken. Make sure that both sides of any steps have handrails.  Any raised decks and porches should have guardrails on the edges.  Have any leaves, snow, or ice cleared regularly.  Use sand or salt on walking paths during winter.  Clean up any spills in your garage right away. This includes oil or grease spills. What can I do in the bathroom?  Use night lights.  Install grab bars by the toilet and in the tub and shower. Do not use towel bars as grab bars.  Use non-skid mats or decals in the tub or shower.  If you need to sit down in the shower, use a plastic, non-slip stool.  Keep the floor dry. Clean up any water that spills on the floor as soon as it  happens.  Remove soap buildup in the tub or shower regularly.  Attach bath mats securely with double-sided non-slip rug tape.  Do not have throw rugs and other things on the floor that can make you trip. What can I do in the bedroom?  Use night lights.  Make sure that you have a light by your bed that is easy to reach.  Do not use any sheets or blankets that are too big for your bed. They should not hang down onto the floor.  Have a firm chair that has side arms. You can use this for support while you get dressed.  Do not have throw rugs and other things on the floor that can make you trip. What can I do in the kitchen?  Clean up any spills right away.  Avoid walking on wet floors.  Keep items that you use a lot in easy-to-reach places.  If you need to reach something above you, use a strong step stool that has a grab bar.  Keep electrical cords out of the way.  Do not use floor polish or wax that makes floors slippery. If you must use wax, use non-skid floor wax.  Do not have throw rugs and other things on the floor that can  make you trip. What can I do with my stairs?  Do not leave any items on the stairs.  Make sure that there are handrails on both sides of the stairs and use them. Fix handrails that are broken or loose. Make sure that handrails are as long as the stairways.  Check any carpeting to make sure that it is firmly attached to the stairs. Fix any carpet that is loose or worn.  Avoid having throw rugs at the top or bottom of the stairs. If you do have throw rugs, attach them to the floor with carpet tape.  Make sure that you have a light switch at the top of the stairs and the bottom of the stairs. If you do not have them, ask someone to add them for you. What else can I do to help prevent falls?  Wear shoes that:  Do not have high heels.  Have rubber bottoms.  Are comfortable and fit you well.  Are closed at the toe. Do not wear sandals.  If you  use a stepladder:  Make sure that it is fully opened. Do not climb a closed stepladder.  Make sure that both sides of the stepladder are locked into place.  Ask someone to hold it for you, if possible.  Clearly mark and make sure that you can see:  Any grab bars or handrails.  First and last steps.  Where the edge of each step is.  Use tools that help you move around (mobility aids) if they are needed. These include:  Canes.  Walkers.  Scooters.  Crutches.  Turn on the lights when you go into a dark area. Replace any light bulbs as soon as they burn out.  Set up your furniture so you have a clear path. Avoid moving your furniture around.  If any of your floors are uneven, fix them.  If there are any pets around you, be aware of where they are.  Review your medicines with your doctor. Some medicines can make you feel dizzy. This can increase your chance of falling. Ask your doctor what other things that you can do to help prevent falls. This information is not intended to replace advice given to you by your health care provider. Make sure you discuss any questions you have with your health care provider. Document Released: 08/22/2009 Document Revised: 04/02/2016 Document Reviewed: 11/30/2014 Elsevier Interactive Patient Education  2017 [employer].

## 2020-05-14 NOTE — Progress Notes (Signed)
Subjective:   Glenda Rios is a 74 y.o. female who presents for Medicare Annual (Subsequent) preventive examination.  Review of Systems     Cardiac Risk Factors include: advanced age (>60men, >16 women);hypertension     Objective:    Today's Vitals   05/14/20 1458  Weight: 105 lb 9.6 oz (47.9 kg)  Height: 5' (1.524 m)   Body mass index is 20.62 kg/m.  Advanced Directives 05/14/2020 04/13/2019  Does Patient Have a Medical Advance Directive? Yes Yes  Type of Advance Directive Living will;Healthcare Power of Attorney Living will;Healthcare Power of Arial in Chart? No - copy requested No - copy requested    Current Medications (verified) Outpatient Encounter Medications as of 05/14/2020  Medication Sig  . ALOE PO Take 300 mg by mouth daily.  Marland Kitchen amLODipine (NORVASC) 5 MG tablet TAKE 1 TABLET BY MOUTH ONCE DAILY FOR 90 DAYS  . calcium citrate-vitamin D (CITRACAL+D) 315-200 MG-UNIT tablet Take 1 tablet by mouth 2 (two) times daily.  . Coenzyme Q10 (CO Q10) 60 MG CAPS Take 1 capsule by mouth daily.  . Cranberry 600 MG TABS Take 1 tablet by mouth daily.  . Cyanocobalamin (TH VITAMIN B-12 PO) Take 1 tablet by mouth daily.  Marland Kitchen levothyroxine (SYNTHROID) 50 MCG tablet Take 1 tablet by mouth once daily  . Lysine 500 MG CAPS Take 1 capsule by mouth daily.  Marland Kitchen omeprazole (PRILOSEC) 40 MG capsule Take 1 capsule by mouth once daily  . oxybutynin (DITROPAN-XL) 10 MG 24 hr tablet Take 10 mg by mouth daily.  . Probiotic Product (PROBIOTIC-10) CAPS Take 1 capsule by mouth daily.  Marland Kitchen SALINE NASAL SPRAY NA Place into the nose 2 (two) times a Rios.  Marland Kitchen tiZANidine (ZANAFLEX) 4 MG tablet Take 1 tablet (4 mg total) by mouth every 8 (eight) hours as needed for muscle spasms.  . celecoxib (CELEBREX) 100 MG capsule Take 1 capsule (100 mg total) by mouth 2 (two) times daily. (Patient not taking: Reported on 05/14/2020)   No facility-administered encounter medications on  file as of 05/14/2020.    Allergies (verified) Iohexol and Codeine   History: Past Medical History:  Diagnosis Date  . Allergy   . Arthritis   . Colon polyps   . GERD (gastroesophageal reflux disease)   . History of chicken pox   . Hypertension   . Thyroid disease    Past Surgical History:  Procedure Laterality Date  . APPENDECTOMY  1995  . BACK SURGERY  2007, 2012  . CHOLECYSTECTOMY  1995   Family History  Problem Relation Age of Onset  . Breast cancer Maternal Aunt   . Hypertension Mother   . Stroke Mother   . Cancer Mother        Bone Cancer  . Diabetes Father   . Heart disease Sister   . Alcohol abuse Sister   . Heart disease Sister   . Heart disease Sister   . Rheumatic fever Sister    Social History   Socioeconomic History  . Marital status: Married    Spouse name: Pilar Plate  . Number of children: Not on file  . Years of education: Not on file  . Highest education level: Not on file  Occupational History  . Not on file  Tobacco Use  . Smoking status: Never Smoker  . Smokeless tobacco: Never Used  Vaping Use  . Vaping Use: Never used  Substance and Sexual Activity  . Alcohol use:  Not Currently  . Drug use: Not Currently  . Sexual activity: Not on file  Other Topics Concern  . Not on file  Social History Narrative  . Not on file   Social Determinants of Health   Financial Resource Strain: Low Risk   . Difficulty of Paying Living Expenses: Not hard at all  Food Insecurity: No Food Insecurity  . Worried About Charity fundraiser in the Last Year: Never true  . Ran Out of Food in the Last Year: Never true  Transportation Needs: No Transportation Needs  . Lack of Transportation (Medical): No  . Lack of Transportation (Non-Medical): No  Physical Activity: Insufficiently Active  . Days of Exercise per Week: 3 days  . Minutes of Exercise per Session: 20 min  Stress: Stress Concern Present  . Feeling of Stress : To some extent  Social Connections:  Moderately Isolated  . Frequency of Communication with Friends and Family: More than three times a week  . Frequency of Social Gatherings with Friends and Family: More than three times a week  . Attends Religious Services: Never  . Active Member of Clubs or Organizations: No  . Attends Archivist Meetings: Never  . Marital Status: Married    Tobacco Counseling Counseling given: Not Answered   Clinical Intake:  Pre-visit preparation completed: Yes  Pain : No/denies pain     Nutritional Status: BMI of 19-24  Normal Nutritional Risks: Unintentional weight loss (recently lost 13lbs but aatributes this to taking care of her husband who is on hospice crrea) Diabetes: No  How often do you need to have someone help you when you read instructions, pamphlets, or other written materials from your doctor or pharmacy?: 1 - Never  Diabetic?no  Interpreter Needed?: No  Information entered by :: Caroleen Hamman LPN   Activities of Daily Living In your present state of health, do you have any difficulty performing the following activities: 05/14/2020 08/24/2019  Hearing? N N  Vision? N N  Difficulty concentrating or making decisions? N N  Walking or climbing stairs? N N  Dressing or bathing? N N  Doing errands, shopping? N N  Preparing Food and eating ? N -  Using the Toilet? N -  In the past six months, have you accidently leaked urine? N -  Do you have problems with loss of bowel control? N -  Managing your Medications? N -  Managing your Finances? N -  Housekeeping or managing your Housekeeping? N -  Some recent data might be hidden    Patient Care Team: Midge Minium, MD as PCP - General (Family Medicine) Wonda Horner, MD as Consulting Physician (Gastroenterology) Luberta Mutter, MD as Consulting Physician (Ophthalmology) Gavin Pound, MD as Consulting Physician (Rheumatology) Thurnell Lose, MD as Consulting Physician (Obstetrics and  Gynecology) Franchot Gallo, MD as Consulting Physician (Urology)  Indicate any recent Medical Services you may have received from other than [facility] providers in the past year (date may be approximate).     Assessment:   This is a routine wellness examination for Glenda Rios.  Hearing/Vision screen  Hearing Screening   125Hz  250Hz  500Hz  1000Hz  2000Hz  3000Hz  4000Hz  6000Hz  8000Hz   Right ear:           Left ear:           Comments: No hearing loss  Vision Screening Comments: Wears glasses Last eye exam-2019 [facility]  Dietary issues and exercise activities discussed: Current Exercise Habits: Home exercise routine, Type  of exercise: Other - see comments (stationary bike), Time (Minutes): 20, Frequency (Times/Week): 3, Weekly Exercise (Minutes/Week): 60, Intensity: Mild, Exercise limited by: None identified  Goals    . Patient Stated     Maintain current health by staying active.       Depression Screen PHQ 2/9 Scores 05/14/2020 08/24/2019 04/13/2019 03/02/2019 12/28/2018 08/23/2018  PHQ - 2 Score 0 0 0 0 0 0  PHQ- 9 Score - 0 - - 0 0    Fall Risk Fall Risk  05/14/2020 08/24/2019 04/13/2019 12/28/2018  Falls in the past year? 0 0 0 0  Number falls in past yr: 0 0 0 0  Injury with Fall? 0 0 - 0  Follow up Falls prevention discussed Falls evaluation completed - -    Any stairs in or around the home? Yes  If so, are there any without handrails? No  Home free of loose throw rugs in walkways, pet beds, electrical cords, etc? Yes  Adequate lighting in your home to reduce risk of falls? Yes   ASSISTIVE DEVICES UTILIZED TO PREVENT FALLS:  Life alert? No  Use of a cane, walker or w/c? No  Grab bars in the bathroom? Yes  Shower chair or bench in shower? No  Elevated toilet seat or a handicapped toilet? No   TIMED UP AND GO:  Was the test performed? Yes .  Length of time to ambulate 10 feet: 9 sec.   Gait steady and fast without use of assistive device  Cognitive  Function:No cognitive impairment noted. Patient reads & plays word search games frequently. MMSE - Mini Mental State Exam 04/13/2019  Not completed: Unable to complete        Immunizations Immunization History  Administered Date(s) Administered  . Fluad Quad(high Dose 65+) 08/24/2019  . H1N1 08/30/2017  . Influenza, High Dose Seasonal PF 08/31/2007  . Influenza,inj,Quad PF,6+ Mos 07/28/2018  . Moderna SARS-COVID-2 Vaccination 12/14/2019, 01/12/2020  . Pneumococcal Conjugate-13 01/15/2015  . Pneumococcal Polysaccharide-23 02/17/2017  . Tdap 08/31/2007    TDAP status: Due, Education has been provided regarding the importance of this vaccine. Advised may receive this vaccine at local pharmacy or Health Dept. Aware to provide a copy of the vaccination record if obtained from local pharmacy or Health Dept. Verbalized acceptance and understanding.   Flu Vaccine status: Up to date   Pneumococcal vaccine status: Up to date   Covid-19 vaccine status: Completed vaccines  Qualifies for Shingles Vaccine? Yes   Zostavax completed No   Shingrix Completed?: No.    Education has been provided regarding the importance of this vaccine. Patient has been advised to call insurance company to determine out of pocket expense if they have not yet received this vaccine. Advised may also receive vaccine at local pharmacy or Health Dept. Verbalized acceptance and understanding.  Screening Tests Health Maintenance  Topic Date Due  . Hepatitis C Screening  Never done  . TETANUS/TDAP  05/14/2021 (Originally 08/30/2017)  . INFLUENZA VACCINE  06/09/2020  . MAMMOGRAM  07/03/2021  . COLONOSCOPY  07/22/2021  . DEXA SCAN  Completed  . COVID-19 Vaccine  Completed  . PNA vac Low Risk Adult  Completed    Health Maintenance  Health Maintenance Due  Topic Date Due  . Hepatitis C Screening  Never done    Colorectal cancer screening: Completed 07/22/2016. Repeat every 5 years   Mammogram status: Completed  07/04/2019. Repeat every year   Bone Density status: Completed 03/31/2018. Results reflect: Bone density results: OSTEOPOROSIS.  Repeat every 2 years.  Lung Cancer Screening: (Low Dose CT Chest recommended if Age 47-80 years, 30 pack-year currently smoking OR have quit w/in 15years.) does not qualify.    Additional Screening:  Hepatitis C Screening: does qualify; Discuss with PCP  Vision Screening: Recommended annual ophthalmology exams for early detection of glaucoma and other disorders of the eye. Is the patient up to date with their annual eye exam?  No  Who is the provider or what is the name of the office in which the patient attends annual eye exams? [facility] If pt is not established with a provider, would they like to be referred to a provider to establish care? No .   Dental Screening: Recommended annual dental exams for proper oral hygiene  Community Resource Referral / Chronic Care Management: CRR required this visit?  No   CCM required this visit?  No      Plan:     I have personally reviewed and noted the following in the patient's chart:   . Medical and social history . Use of alcohol, tobacco or illicit drugs  . Current medications and supplements . Functional ability and status . Nutritional status . Physical activity . Advanced directives . List of other physicians . Hospitalizations, surgeries, and ER visits in previous 12 months . Vitals . Screenings to include cognitive, depression, and falls . Referrals and appointments  In addition, I have reviewed and discussed with patient certain preventive protocols, quality metrics, and best practice recommendations. A written personalized care plan for preventive services as well as general preventive health recommendations were provided to patient.     Marta Antu, LPN   01/08/4387   Nurse Notes: Patient states she has been caring for her husband who has terminal cancer & is now on Hospice.  She has recently lost 13 pounds. She thinks it is from the stress of caring for him. Her daughters have recently started helping with his care.  She also feels that her thyroid medication may be too strong & that could also be a contributing factor to the weight loss. Patient advised to make an appt to see PCP. Patient made an appt with PCP before leaving today.

## 2020-05-17 NOTE — Progress Notes (Signed)
LPN MWV note reviewed in PCP absence. Follow-up with PCP as scheduled.   Leeanne Rio, PA-C

## 2020-05-30 ENCOUNTER — Encounter: Payer: Self-pay | Admitting: Family Medicine

## 2020-05-30 ENCOUNTER — Other Ambulatory Visit: Payer: Self-pay

## 2020-05-30 ENCOUNTER — Ambulatory Visit (INDEPENDENT_AMBULATORY_CARE_PROVIDER_SITE_OTHER): Payer: Medicare Other | Admitting: Family Medicine

## 2020-05-30 VITALS — BP 123/73 | HR 76 | Temp 97.2°F | Resp 16 | Ht 60.0 in | Wt 105.0 lb

## 2020-05-30 DIAGNOSIS — F4321 Adjustment disorder with depressed mood: Secondary | ICD-10-CM | POA: Diagnosis not present

## 2020-05-30 DIAGNOSIS — I10 Essential (primary) hypertension: Secondary | ICD-10-CM

## 2020-05-30 DIAGNOSIS — M810 Age-related osteoporosis without current pathological fracture: Secondary | ICD-10-CM

## 2020-05-30 DIAGNOSIS — Z1231 Encounter for screening mammogram for malignant neoplasm of breast: Secondary | ICD-10-CM | POA: Diagnosis not present

## 2020-05-30 DIAGNOSIS — E079 Disorder of thyroid, unspecified: Secondary | ICD-10-CM

## 2020-05-30 NOTE — Patient Instructions (Addendum)
Follow up in 6 months to recheck BP (sooner if needed) We'll notify you of your lab results and make any changes if needed Make sure you are eating regularly Schedule your shingles shot at the pharmacy Go ahead and schedule your mammo and bone density (the orders are in) Please consider Hospice grief counseling- they are wonderful Call with any questions or concerns I am so sorry for your loss Elbert Ewings in there!!!

## 2020-05-30 NOTE — Progress Notes (Signed)
   Subjective:    Patient ID: Glenda Rios, female    DOB: 03-27-1946, 74 y.o.   MRN: [id number]  HPI HTN- chronic problem, on Amlodipine 5mg  daily w/ good control.  Denies CP, SOB, HAs, visual changes, edema.  Hypothyroid- chronic problem, on Levothyroxine 39mcg daily.  Denies changes to skin/hair/nails.  She is concerned about weight loss.  Now 105 lbs.  Grief- husband passed on 05/21/23.  He was able to pass at home as he wished and she was with him.  She feels she is doing ok.  Son and 2 daughters are very supportive.   Review of Systems For ROS see HPI   This visit occurred during the SARS-CoV-2 public health emergency.  Safety protocols were in place, including screening questions prior to the visit, additional usage of staff PPE, and extensive cleaning of exam room while observing appropriate contact time as indicated for disinfecting solutions.       Objective:   Physical Exam Vitals reviewed.  Constitutional:      General: She is not in acute distress.    Appearance: Normal appearance. She is well-developed.  HENT:     Head: Normocephalic and atraumatic.  Eyes:     Conjunctiva/sclera: Conjunctivae normal.     Pupils: Pupils are equal, round, and reactive to light.  Neck:     Thyroid: No thyromegaly.  Cardiovascular:     Rate and Rhythm: Normal rate and regular rhythm.     Heart sounds: Normal heart sounds. No murmur heard.   Pulmonary:     Effort: Pulmonary effort is normal. No respiratory distress.     Breath sounds: Normal breath sounds.  Abdominal:     General: There is no distension.     Palpations: Abdomen is soft.     Tenderness: There is no abdominal tenderness.  Musculoskeletal:     Cervical back: Normal range of motion and neck supple.  Lymphadenopathy:     Cervical: No cervical adenopathy.  Skin:    General: Skin is warm and dry.  Neurological:     Mental Status: She is alert and oriented to person, place, and time.  Psychiatric:        Behavior:  Behavior normal.           Assessment & Plan:  Grief- pt lost her husband 15 days ago.  She is doing very well and has a good support system but this is still very raw.  Encouraged her to seek grief counseling through Hosp[facility].  Will follow.

## 2020-05-30 NOTE — Assessment & Plan Note (Signed)
Chronic problem.  Well controlled today on Amlodipine 5mg  daily.  Currently asymptomatic.  Will continue to follow.

## 2020-05-30 NOTE — Assessment & Plan Note (Signed)
Ongoing issue for pt.  She reports weight loss and feeling jittery inside.  This may be her recent stress but she feels that her thyroid 'isn't right'.  Check labs.  Adjust meds prn

## 2020-05-30 NOTE — Assessment & Plan Note (Signed)
Order for DEXA placed.  Check Vit D level and replete prn.

## 2020-05-31 ENCOUNTER — Other Ambulatory Visit: Payer: Self-pay | Admitting: General Practice

## 2020-05-31 DIAGNOSIS — E079 Disorder of thyroid, unspecified: Secondary | ICD-10-CM

## 2020-05-31 LAB — LIPID PANEL
Cholesterol: 174 mg/dL (ref 0–200)
HDL: 68.2 mg/dL (ref >39.00)
LDL Cholesterol: 91 mg/dL (ref 0–99)
NonHDL: 105.86
Total CHOL/HDL Ratio: 3
Triglycerides: 72 mg/dL (ref 0.0–149.0)
VLDL: 14.4 mg/dL (ref 0.0–40.0)

## 2020-05-31 LAB — CBC WITH DIFFERENTIAL/PLATELET
Basophils Absolute: 0.1 10*3/uL (ref 0.0–0.1)
Basophils Relative: 0.8 % (ref 0.0–3.0)
Eosinophils Absolute: 0 10*3/uL (ref 0.0–0.7)
Eosinophils Relative: 0.6 % (ref 0.0–5.0)
HCT: 37.2 % (ref 36.0–46.0)
Hemoglobin: 12.9 g/dL (ref 12.0–15.0)
Lymphocytes Relative: 15.3 % (ref 12.0–46.0)
Lymphs Abs: 1.1 10*3/uL (ref 0.7–4.0)
MCHC: 34.8 g/dL (ref 30.0–36.0)
MCV: 93 fl (ref 78.0–100.0)
Monocytes Absolute: 0.5 10*3/uL (ref 0.1–1.0)
Monocytes Relative: 6.8 % (ref 3.0–12.0)
Neutro Abs: 5.3 10*3/uL (ref 1.4–7.7)
Neutrophils Relative %: 76.5 % (ref 43.0–77.0)
Platelets: 207 10*3/uL (ref 150.0–400.0)
RBC: 4 Mil/uL (ref 3.87–5.11)
RDW: 13 % (ref 11.5–15.5)
WBC: 6.9 10*3/uL (ref 4.0–10.5)

## 2020-05-31 LAB — BASIC METABOLIC PANEL
BUN: 18 mg/dL (ref 6–23)
CO2: 29 mEq/L (ref 19–32)
Calcium: 9.8 mg/dL (ref 8.4–10.5)
Chloride: 104 mEq/L (ref 96–112)
Creatinine, Ser: 0.83 mg/dL (ref 0.40–1.20)
GFR: 67.6 mL/min (ref >60.00)
Glucose, Bld: 96 mg/dL (ref 70–99)
Potassium: 4.2 mEq/L (ref 3.5–5.1)
Sodium: 139 mEq/L (ref 135–145)

## 2020-05-31 LAB — T4, FREE: Free T4: 0.94 ng/dL (ref 0.60–1.60)

## 2020-05-31 LAB — HEPATIC FUNCTION PANEL
ALT: 16 U/L (ref 0–35)
AST: 17 U/L (ref 0–37)
Albumin: 4.4 g/dL (ref 3.5–5.2)
Alkaline Phosphatase: 37 U/L — ABNORMAL LOW (ref 39–117)
Bilirubin, Direct: 0.1 mg/dL (ref 0.0–0.3)
Total Bilirubin: 0.4 mg/dL (ref 0.2–1.2)
Total Protein: 7 g/dL (ref 6.0–8.3)

## 2020-05-31 LAB — T3, FREE: T3, Free: 2.9 pg/mL (ref 2.3–4.2)

## 2020-05-31 LAB — TSH: TSH: 1.9 u[IU]/mL (ref 0.35–4.50)

## 2020-05-31 LAB — VITAMIN D 25 HYDROXY (VIT D DEFICIENCY, FRACTURES): VITD: 32.03 ng/mL (ref 30.00–100.00)

## 2020-06-10 DIAGNOSIS — N3000 Acute cystitis without hematuria: Secondary | ICD-10-CM | POA: Diagnosis not present

## 2020-06-10 DIAGNOSIS — R350 Frequency of micturition: Secondary | ICD-10-CM | POA: Diagnosis not present

## 2020-06-17 ENCOUNTER — Ambulatory Visit (INDEPENDENT_AMBULATORY_CARE_PROVIDER_SITE_OTHER)
Admission: RE | Admit: 2020-06-17 | Discharge: 2020-06-17 | Disposition: A | Discharge status: Home / Self Care | Payer: Medicare Other | Source: Ambulatory Visit | Attending: Physician Assistant | Admitting: Physician Assistant

## 2020-06-17 ENCOUNTER — Encounter: Payer: Self-pay | Admitting: Physician Assistant

## 2020-06-17 ENCOUNTER — Other Ambulatory Visit: Payer: Self-pay

## 2020-06-17 ENCOUNTER — Ambulatory Visit (INDEPENDENT_AMBULATORY_CARE_PROVIDER_SITE_OTHER): Payer: Medicare Other | Admitting: Physician Assistant

## 2020-06-17 VITALS — BP 130/70 | HR 92 | Temp 99.2°F | Resp 16 | Ht 60.0 in | Wt 102.6 lb

## 2020-06-17 DIAGNOSIS — E079 Disorder of thyroid, unspecified: Secondary | ICD-10-CM

## 2020-06-17 DIAGNOSIS — R1013 Epigastric pain: Secondary | ICD-10-CM | POA: Diagnosis not present

## 2020-06-17 DIAGNOSIS — M542 Cervicalgia: Secondary | ICD-10-CM

## 2020-06-17 MED ORDER — PANTOPRAZOLE SODIUM 40 MG PO TBEC
40.0000 mg | DELAYED_RELEASE_TABLET | Freq: Every day | ORAL | 3 refills | Status: DC
Start: 2020-06-17 — End: 2020-10-23

## 2020-06-17 MED ORDER — TIZANIDINE HCL 4 MG PO TABS: 4.0000 mg | ORAL_TABLET | Freq: Every day | ORAL | 0 refills | Status: DC

## 2020-06-17 NOTE — Progress Notes (Signed)
Patient presents to clinic today to discuss multiple concerns.   Patient endorses ongoing pain of her neck and upper back over the past 5 years. Has just continued to slowly progress. Applies topical Biofreeze to the area a few times per day. Has previously been unable to tolerate classic NSAIDs or even Celebrex due to her GERD. Denies radiation or pain down her upper extremities. Denies numbness or tingling of upper extremities. Notes some occasional tension in the posterior neck.  Patient notes epigastric discomfort intermittently, worse over past couple of weeks. Is associated with heartburn and indigestion. Occasional nausea without vomiting. Has significant history of GERD but has not been taking her omeprazole. Restarted yesterday with some improvement. Stopped her Omeprazole as she did not feel it fully controlled her symptoms.    Past Medical History:  Diagnosis Date  . Allergy   . Arthritis   . Colon polyps   . GERD (gastroesophageal reflux disease)   . History of chicken pox   . Hypertension   . Thyroid disease     Current Outpatient Medications on File Prior to Visit  Medication Sig Dispense Refill  . ALOE PO Take 300 mg by mouth daily.    Marland Kitchen amLODipine (NORVASC) 5 MG tablet TAKE 1 TABLET BY MOUTH ONCE DAILY FOR 90 DAYS 90 tablet 1  . calcium citrate-vitamin D (CITRACAL+D) 315-200 MG-UNIT tablet Take 1 tablet by mouth 2 (two) times daily.    . Coenzyme Q10 (CO Q10) 60 MG CAPS Take 1 capsule by mouth daily.    . Cranberry 600 MG TABS Take 1 tablet by mouth daily.    . Cyanocobalamin (TH VITAMIN B-12 PO) Take 1 tablet by mouth daily.    Marland Kitchen levothyroxine (SYNTHROID) 50 MCG tablet Take 1 tablet by mouth once daily 90 tablet 0  . Lysine 500 MG CAPS Take 1 capsule by mouth daily.    Marland Kitchen omeprazole (PRILOSEC) 40 MG capsule Take 1 capsule by mouth once daily 90 capsule 0  . oxybutynin (DITROPAN-XL) 10 MG 24 hr tablet Take 10 mg by mouth daily.  5  . Probiotic Product (PROBIOTIC-10)  CAPS Take 1 capsule by mouth daily.    Marland Kitchen SALINE NASAL SPRAY NA Place into the nose 2 (two) times a day.    Marland Kitchen tiZANidine (ZANAFLEX) 4 MG tablet Take 1 tablet (4 mg total) by mouth every 8 (eight) hours as needed for muscle spasms. 60 tablet 1  . celecoxib (CELEBREX) 100 MG capsule Take 1 capsule (100 mg total) by mouth 2 (two) times daily. (Patient not taking: Reported on 06/17/2020) 180 capsule 1   No current facility-administered medications on file prior to visit.    Allergies  Allergen Reactions  . Iohexol Anaphylaxis     Desc: hives,rash,swelling. entered 02/10/2005 bsw   . Codeine Nausea Only    Family History  Problem Relation Age of Onset  . Breast cancer Maternal Aunt   . Hypertension Mother   . Stroke Mother   . Cancer Mother        Bone Cancer  . Diabetes Father   . Heart disease Sister   . Alcohol abuse Sister   . Heart disease Sister   . Heart disease Sister   . Rheumatic fever Sister     Social History   Socioeconomic History  . Marital status: Widowed    Spouse name: Pilar Plate  . Number of children: Not on file  . Years of education: Not on file  . Highest education level: Not  on file  Occupational History  . Not on file  Tobacco Use  . Smoking status: Never Smoker  . Smokeless tobacco: Never Used  Vaping Use  . Vaping Use: Never used  Substance and Sexual Activity  . Alcohol use: Not Currently  . Drug use: Not Currently  . Sexual activity: Not on file  Other Topics Concern  . Not on file  Social History Narrative  . Not on file   Social Determinants of Health   Financial Resource Strain: Low Risk   . Difficulty of Paying Living Expenses: Not hard at all  Food Insecurity: No Food Insecurity  . Worried About Charity fundraiser in the Last Year: Never true  . Ran Out of Food in the Last Year: Never true  Transportation Needs: No Transportation Needs  . Lack of Transportation (Medical): No  . Lack of Transportation (Non-Medical): No  Physical  Activity: Insufficiently Active  . Days of Exercise per Week: 3 days  . Minutes of Exercise per Session: 20 min  Stress: Stress Concern Present  . Feeling of Stress : To some extent  Social Connections: Moderately Isolated  . Frequency of Communication with Friends and Family: More than three times a week  . Frequency of Social Gatherings with Friends and Family: More than three times a week  . Attends Religious Services: Never  . Active Member of Clubs or Organizations: No  . Attends Archivist Meetings: Never  . Marital Status: Married   Review of Systems - See HPI.  All other ROS are negative.  Ht 5' (1.524 m)   Wt 102 lb 9.6 oz (46.5 kg)   BMI 20.04 kg/m   Physical Exam Vitals reviewed.  Constitutional:      Appearance: Normal appearance. She is well-developed.  HENT:     Head: Normocephalic and atraumatic.  Eyes:     Conjunctiva/sclera: Conjunctivae normal.  Cardiovascular:     Rate and Rhythm: Normal rate and regular rhythm.     Pulses: Normal pulses.     Heart sounds: Normal heart sounds.  Pulmonary:     Effort: Pulmonary effort is normal.     Breath sounds: Normal breath sounds.  Abdominal:     General: Bowel sounds are normal.     Tenderness: There is abdominal tenderness in the epigastric area and left upper quadrant.     Hernia: No hernia is present.  Musculoskeletal:     Cervical back: Neck supple. Spasms present. No swelling, deformity or bony tenderness.  Neurological:     General: No focal deficit present.     Mental Status: She is alert and oriented to person, place, and time.  Psychiatric:        Mood and Affect: Mood normal.    Recent Results (from the past 2160 hour(s))  Lipid panel     Status: None   Collection Time: 05/30/20  3:51 PM  Result Value Ref Range   Cholesterol 174 0 - 200 mg/dL    Comment: ATP III Classification       Desirable:  < 200 mg/dL               Borderline High:  200 - 239 mg/dL          High:  > = 240 mg/dL    Triglycerides 72.0 0 - 149 mg/dL    Comment: Normal:  <150 mg/dLBorderline High:  150 - 199 mg/dL   HDL 68.20 >39.00 mg/dL   VLDL 14.4 0.0 -  40.0 mg/dL   LDL Cholesterol 91 0 - 99 mg/dL   Total CHOL/HDL Ratio 3     Comment:                Men          Women1/2 Average Risk     3.4          3.3Average Risk          5.0          4.42X Average Risk          9.6          7.13X Average Risk          15.0          11.0                       NonHDL 105.86     Comment: NOTE:  Non-HDL goal should be 30 mg/dL higher than patient's LDL goal (i.e. LDL goal of < 70 mg/dL, would have non-HDL goal of < 100 mg/dL)  Basic metabolic panel     Status: None   Collection Time: 05/30/20  3:51 PM  Result Value Ref Range   Sodium 139 135 - 145 mEq/L   Potassium 4.2 3.5 - 5.1 mEq/L   Chloride 104 96 - 112 mEq/L   CO2 29 19 - 32 mEq/L   Glucose, Bld 96 70 - 99 mg/dL   BUN 18 6 - 23 mg/dL   Creatinine, Ser 0.83 0.40 - 1.20 mg/dL   GFR 67.60 >60.00 mL/min   Calcium 9.8 8.4 - 10.5 mg/dL  TSH     Status: None   Collection Time: 05/30/20  3:51 PM  Result Value Ref Range   TSH 1.90 0.35 - 4.50 uIU/mL  Hepatic function panel     Status: Abnormal   Collection Time: 05/30/20  3:51 PM  Result Value Ref Range   Total Bilirubin 0.4 0.2 - 1.2 mg/dL   Bilirubin, Direct 0.1 0.0 - 0.3 mg/dL   Alkaline Phosphatase 37 (L) 39 - 117 U/L   AST 17 0 - 37 U/L   ALT 16 0 - 35 U/L   Total Protein 7.0 6.0 - 8.3 g/dL   Albumin 4.4 3.5 - 5.2 g/dL  CBC with Differential/Platelet     Status: None   Collection Time: 05/30/20  3:51 PM  Result Value Ref Range   WBC 6.9 4.0 - 10.5 K/uL   RBC 4.00 3.87 - 5.11 Mil/uL   Hemoglobin 12.9 12.0 - 15.0 g/dL   HCT 37.2 36 - 46 %   MCV 93.0 78.0 - 100.0 fl   MCHC 34.8 30.0 - 36.0 g/dL   RDW 13.0 11.5 - 15.5 %   Platelets 207.0 150 - 400 K/uL   Neutrophils Relative % 76.5 43 - 77 %   Lymphocytes Relative 15.3 12 - 46 %   Monocytes Relative 6.8 3 - 12 %   Eosinophils Relative 0.6 0 - 5 %     Basophils Relative 0.8 0 - 3 %   Neutro Abs 5.3 1.4 - 7.7 K/uL   Lymphs Abs 1.1 0.7 - 4.0 K/uL   Monocytes Absolute 0.5 0 - 1 K/uL   Eosinophils Absolute 0.0 0 - 0 K/uL   Basophils Absolute 0.1 0 - 0 K/uL  VITAMIN D 25 Hydroxy (Vit-D Deficiency, Fractures)     Status: None   Collection Time: 05/30/20  3:51 PM  Result Value  Ref Range   VITD 32.03 30.00 - 100.00 ng/mL  T4, free     Status: None   Collection Time: 05/30/20  3:51 PM  Result Value Ref Range   Free T4 0.94 0.60 - 1.60 ng/dL    Comment: Specimens from patients who are undergoing biotin therapy and /or ingesting biotin supplements may contain high levels of biotin.  The higher biotin concentration in these specimens interferes with this Free T4 assay.  Specimens that contain high levels  of biotin may cause false high results for this Free T4 assay.  Please interpret results in light of the total clinical presentation of the patient.    T3, free     Status: None   Collection Time: 05/30/20  3:51 PM  Result Value Ref Range   T3, Free 2.9 2.3 - 4.2 pg/mL    Assessment/Plan: 1. Cervicalgia Suspect combination of significant OA with muscular tension and spasm which was noted on examination. Can restart low-dose tizanidine. Supportive measures and OTC medications reviewed -- want her to start topical Voltaren. Will check xray today to further assess.  - DG Cervical Spine Complete; Future  2. Epigastric pain 2/2 GERD. Labs today to make sure lipase, CBC and CMP are stable. Start Protonix daily. Can use OTC famotidine for the next few days as PPI continues to build in her system. Follow-up discussed.  - CBC w/Diff - Lipase - Comp Met (CMET)  3. Thyroid disease Recent change in dose from PCP. Will repeat TSH level today giving patient concern over levels to make sure heading in the right direction. - TSH  This visit occurred during the SARS-CoV-2 public health emergency.  Safety protocols were in place, including screening  questions prior to the visit, additional usage of staff PPE, and extensive cleaning of exam room while observing appropriate contact time as indicated for disinfecting solutions.     Leeanne Rio, PA-C

## 2020-06-17 NOTE — Patient Instructions (Addendum)
Please go to the lab today for blood work.  I will call you with your results. We will alter treatment regimen(s) if indicated by your results.   Please go to our [place] office at [address] [place], [place]. They can go your x-ray M-F 8 am - 5 pm.  I will call once we have results.  For now, use the tizanidine in the evening.  Also start Tylenol arthritis - can use up to every 8 hours. We will alter treatment once we get results.   Stop the Omeprazole and start the Protonix, taking daily as directed.  Keep a bland diet until we get our lab results.  Hang in there!   Food Choices for Gastroesophageal Reflux Disease, Adult When you have gastroesophageal reflux disease (GERD), the foods you eat and your eating habits are very important. Choosing the right foods can help ease your discomfort. Think about working with a nutrition specialist (dietitian) to help you make good choices. What are tips for following this plan?  Meals  Choose healthy foods that are low in fat, such as fruits, vegetables, whole grains, low-fat dairy products, and lean meat, fish, and poultry.  Eat small meals often instead of 3 large meals a day. Eat your meals slowly, and in a place where you are relaxed. Avoid bending over or lying down until 2-3 hours after eating.  Avoid eating meals 2-3 hours before bed.  Avoid drinking a lot of liquid with meals.  Cook foods using methods other than frying. Bake, grill, or broil food instead.  Avoid or limit: ? Chocolate. ? Peppermint or spearmint. ? Alcohol. ? Pepper. ? Black and decaffeinated coffee. ? Black and decaffeinated tea. ? Bubbly (carbonated) soft drinks. ? Caffeinated energy drinks and soft drinks.  Limit high-fat foods such as: ? Fatty meat or fried foods. ? Whole milk, cream, butter, or ice cream. ? Nuts and nut butters. ? Pastries, donuts, and sweets made with butter or shortening.  Avoid foods that cause symptoms. These foods may be  different for everyone. Common foods that cause symptoms include: ? Tomatoes. ? Oranges, lemons, and limes. ? Peppers. ? Spicy food. ? Onions and garlic. ? Vinegar. Lifestyle  Maintain a healthy weight. Ask your doctor what weight is healthy for you. If you need to lose weight, work with your doctor to do so safely.  Exercise for at least 30 minutes for 5 or more days each week, or as told by your doctor.  Wear loose-fitting clothes.  Do not smoke. If you need help quitting, ask your doctor.  Sleep with the head of your bed higher than your feet. Use a wedge under the mattress or blocks under the bed frame to raise the head of the bed. Summary  When you have gastroesophageal reflux disease (GERD), food and lifestyle choices are very important in easing your symptoms.  Eat small meals often instead of 3 large meals a day. Eat your meals slowly, and in a place where you are relaxed.  Limit high-fat foods such as fatty meat or fried foods.  Avoid bending over or lying down until 2-3 hours after eating.  Avoid peppermint and spearmint, caffeine, alcohol, and chocolate. This information is not intended to replace advice given to you by your health care provider. Make sure you discuss any questions you have with your health care provider. Document Revised: 02/16/2019 Document Reviewed: 12/01/2016 Elsevier Patient Education  [place].

## 2020-06-18 ENCOUNTER — Other Ambulatory Visit: Payer: Self-pay

## 2020-06-18 LAB — COMPREHENSIVE METABOLIC PANEL
ALT: 16 U/L (ref 0–35)
AST: 18 U/L (ref 0–37)
Albumin: 4.6 g/dL (ref 3.5–5.2)
Alkaline Phosphatase: 35 U/L — ABNORMAL LOW (ref 39–117)
BUN: 21 mg/dL (ref 6–23)
CO2: 27 mEq/L (ref 19–32)
Calcium: 9.8 mg/dL (ref 8.4–10.5)
Chloride: 102 mEq/L (ref 96–112)
Creatinine, Ser: 0.83 mg/dL (ref 0.40–1.20)
GFR: 67.59 mL/min (ref >60.00)
Glucose, Bld: 98 mg/dL (ref 70–99)
Potassium: 4.2 mEq/L (ref 3.5–5.1)
Sodium: 139 mEq/L (ref 135–145)
Total Bilirubin: 0.4 mg/dL (ref 0.2–1.2)
Total Protein: 7.4 g/dL (ref 6.0–8.3)

## 2020-06-18 LAB — CBC WITH DIFFERENTIAL/PLATELET
Basophils Absolute: 0 10*3/uL (ref 0.0–0.1)
Basophils Relative: 0.5 % (ref 0.0–3.0)
Eosinophils Absolute: 0.1 10*3/uL (ref 0.0–0.7)
Eosinophils Relative: 0.6 % (ref 0.0–5.0)
HCT: 38.3 % (ref 36.0–46.0)
Hemoglobin: 13.1 g/dL (ref 12.0–15.0)
Lymphocytes Relative: 12.7 % (ref 12.0–46.0)
Lymphs Abs: 1.1 10*3/uL (ref 0.7–4.0)
MCHC: 34.3 g/dL (ref 30.0–36.0)
MCV: 93.7 fl (ref 78.0–100.0)
Monocytes Absolute: 0.5 10*3/uL (ref 0.1–1.0)
Monocytes Relative: 6.4 % (ref 3.0–12.0)
Neutro Abs: 6.6 10*3/uL (ref 1.4–7.7)
Neutrophils Relative %: 79.8 % — ABNORMAL HIGH (ref 43.0–77.0)
Platelets: 259 10*3/uL (ref 150.0–400.0)
RBC: 4.09 Mil/uL (ref 3.87–5.11)
RDW: 12.9 % (ref 11.5–15.5)
WBC: 8.3 10*3/uL (ref 4.0–10.5)

## 2020-06-18 LAB — LIPASE: Lipase: 57 U/L (ref 11.0–59.0)

## 2020-06-18 LAB — TSH: TSH: 2.9 u[IU]/mL (ref 0.35–4.50)

## 2020-06-18 NOTE — Progress Notes (Signed)
Opened in error

## 2020-06-19 ENCOUNTER — Other Ambulatory Visit: Payer: Self-pay

## 2020-06-19 DIAGNOSIS — M542 Cervicalgia: Secondary | ICD-10-CM

## 2020-06-28 ENCOUNTER — Ambulatory Visit: Payer: Medicare Other

## 2020-06-28 ENCOUNTER — Telehealth: Payer: Self-pay | Admitting: Family Medicine

## 2020-06-28 NOTE — Telephone Encounter (Signed)
Spoke with patient and patients daughter today.  Both were exposed to a friend on the 12th who was dignosed with COVID on the 18th.  Both patient and daughter said they have started to develop a runny nose that they believe to be allergies.   Both are supposed to meet with funeral home tomorrow in regard to spouse/fathers remains and wanted Dr. Virgil Benedict thoughts.    Per Dr. Birdie Riddle, she has suggested that both get tested before meeting with anyone.    Patient had lab appt for today.    I have moved that out by two weeks.

## 2020-07-03 DIAGNOSIS — M5412 Radiculopathy, cervical region: Secondary | ICD-10-CM | POA: Insufficient documentation

## 2020-07-03 DIAGNOSIS — M542 Cervicalgia: Secondary | ICD-10-CM | POA: Diagnosis not present

## 2020-07-11 DIAGNOSIS — M542 Cervicalgia: Secondary | ICD-10-CM | POA: Diagnosis not present

## 2020-07-12 ENCOUNTER — Other Ambulatory Visit: Payer: Self-pay

## 2020-07-12 ENCOUNTER — Ambulatory Visit (INDEPENDENT_AMBULATORY_CARE_PROVIDER_SITE_OTHER): Payer: Medicare Other

## 2020-07-12 DIAGNOSIS — E079 Disorder of thyroid, unspecified: Secondary | ICD-10-CM | POA: Diagnosis not present

## 2020-07-12 LAB — TSH: TSH: 3.1 u[IU]/mL (ref 0.35–4.50)

## 2020-07-17 DIAGNOSIS — M542 Cervicalgia: Secondary | ICD-10-CM | POA: Diagnosis not present

## 2020-07-17 DIAGNOSIS — I10 Essential (primary) hypertension: Secondary | ICD-10-CM | POA: Diagnosis not present

## 2020-07-18 ENCOUNTER — Telehealth: Payer: Self-pay | Admitting: Family Medicine

## 2020-07-18 NOTE — Telephone Encounter (Signed)
Please call Glenda Rios - she states that she is supposed to call you back and let you know how she is feeling.

## 2020-07-18 NOTE — Telephone Encounter (Signed)
Patient notified of PCP recommendations and is agreement and expresses an understanding.

## 2020-07-18 NOTE — Telephone Encounter (Signed)
Called pt back. She advised that she is nowhere as jittery as she had been before on the thyroid medication. She would like to know if you want her to remain off of it or if you want her on a lower dose? Please advise.

## 2020-07-18 NOTE — Telephone Encounter (Signed)
Since she's feeling better and TSH was normal, continue to hold medication

## 2020-08-26 ENCOUNTER — Ambulatory Visit
Admission: RE | Admit: 2020-08-26 | Discharge: 2020-08-26 | Disposition: A | Discharge status: Home / Self Care | Payer: Medicare Other | Source: Ambulatory Visit | Attending: Family Medicine | Admitting: Family Medicine

## 2020-08-26 ENCOUNTER — Other Ambulatory Visit: Payer: Self-pay

## 2020-08-26 DIAGNOSIS — M810 Age-related osteoporosis without current pathological fracture: Secondary | ICD-10-CM

## 2020-08-26 DIAGNOSIS — Z1231 Encounter for screening mammogram for malignant neoplasm of breast: Secondary | ICD-10-CM

## 2020-08-26 DIAGNOSIS — Z780 Asymptomatic menopausal state: Secondary | ICD-10-CM | POA: Diagnosis not present

## 2020-08-26 DIAGNOSIS — M85852 Other specified disorders of bone density and structure, left thigh: Secondary | ICD-10-CM | POA: Diagnosis not present

## 2020-08-27 DIAGNOSIS — H5203 Hypermetropia, bilateral: Secondary | ICD-10-CM | POA: Diagnosis not present

## 2020-08-27 DIAGNOSIS — H25043 Posterior subcapsular polar age-related cataract, bilateral: Secondary | ICD-10-CM | POA: Diagnosis not present

## 2020-08-27 DIAGNOSIS — H35373 Puckering of macula, bilateral: Secondary | ICD-10-CM | POA: Diagnosis not present

## 2020-08-28 ENCOUNTER — Telehealth: Payer: Self-pay | Admitting: Family Medicine

## 2020-08-28 NOTE — Telephone Encounter (Signed)
Pt called in stating she would like to talk to the nurse about the Prolia injections, she states that she has had some bad side effects from the injection in the past, pt can be reached at the cell #

## 2020-08-29 NOTE — Telephone Encounter (Signed)
Called patient to get additional information. Patient states that she was on prolia injections for 1 1/2 years and it gave her severe joint pain. She would like to know your thoughts on her taking the Tri Vita (Bone Growth Factor) faithfully, it that could possibly help her. Please advise.

## 2020-08-29 NOTE — Telephone Encounter (Signed)
It certainly won't harm you but I don't see any hard evidence (randomized controlled trials) that this works as well as other options.  But you could certainly try it

## 2020-08-30 NOTE — Telephone Encounter (Signed)
Called patient in reference to Requip. Patient voiced understanding.

## 2020-08-30 NOTE — Telephone Encounter (Signed)
We have not discussed Requip so I am confused by this message

## 2020-08-30 NOTE — Telephone Encounter (Signed)
Requip is used for Restless Leg syndrome and NOT osteoporosis.  It would not be appropriate in this setting

## 2020-08-30 NOTE — Telephone Encounter (Signed)
Called patient in reference to PCP recommendations. Patient voiced understanding. She also states if you think she should try the requip, then she will try it if it is covered by her insurance. If not she states she will stick to the Tri Vita and see if it helps.

## 2020-08-30 NOTE — Telephone Encounter (Signed)
Patient states she hasn't been on this medication before but has heard about it. She states she is unaware of the risk and side effects of this medication. Would you suggest a video visit for patient to go over this with you? Please advise.

## 2020-09-09 DIAGNOSIS — M542 Cervicalgia: Secondary | ICD-10-CM | POA: Diagnosis not present

## 2020-09-11 DIAGNOSIS — M542 Cervicalgia: Secondary | ICD-10-CM | POA: Diagnosis not present

## 2020-09-18 DIAGNOSIS — M542 Cervicalgia: Secondary | ICD-10-CM | POA: Diagnosis not present

## 2020-09-18 DIAGNOSIS — I10 Essential (primary) hypertension: Secondary | ICD-10-CM | POA: Diagnosis not present

## 2020-09-20 DIAGNOSIS — M542 Cervicalgia: Secondary | ICD-10-CM | POA: Diagnosis not present

## 2020-09-24 DIAGNOSIS — M542 Cervicalgia: Secondary | ICD-10-CM | POA: Diagnosis not present

## 2020-09-26 DIAGNOSIS — Z23 Encounter for immunization: Secondary | ICD-10-CM | POA: Diagnosis not present

## 2020-09-27 DIAGNOSIS — M542 Cervicalgia: Secondary | ICD-10-CM | POA: Diagnosis not present

## 2020-10-07 DIAGNOSIS — M542 Cervicalgia: Secondary | ICD-10-CM | POA: Diagnosis not present

## 2020-10-10 DIAGNOSIS — M542 Cervicalgia: Secondary | ICD-10-CM | POA: Diagnosis not present

## 2020-10-14 DIAGNOSIS — M542 Cervicalgia: Secondary | ICD-10-CM | POA: Diagnosis not present

## 2020-10-21 DIAGNOSIS — M542 Cervicalgia: Secondary | ICD-10-CM | POA: Diagnosis not present

## 2020-10-22 ENCOUNTER — Other Ambulatory Visit: Payer: Self-pay | Admitting: Physician Assistant

## 2020-10-24 DIAGNOSIS — M542 Cervicalgia: Secondary | ICD-10-CM | POA: Diagnosis not present

## 2020-11-12 ENCOUNTER — Other Ambulatory Visit: Payer: Self-pay | Admitting: Family Medicine

## 2020-12-09 ENCOUNTER — Other Ambulatory Visit: Payer: Self-pay | Admitting: Physician Assistant

## 2021-02-14 ENCOUNTER — Other Ambulatory Visit: Payer: Self-pay | Admitting: Family Medicine

## 2021-02-22 ENCOUNTER — Other Ambulatory Visit: Payer: Self-pay | Admitting: Family Medicine

## 2021-02-24 ENCOUNTER — Other Ambulatory Visit: Payer: Self-pay

## 2021-02-24 MED ORDER — AMLODIPINE BESYLATE 5 MG PO TABS: ORAL_TABLET | ORAL | 0 refills | Status: DC

## 2021-02-24 NOTE — Telephone Encounter (Signed)
Prescription sent in for a 30 day supply and pharmacy notified patient will not receive further refills without an appointment.

## 2021-02-24 NOTE — Telephone Encounter (Signed)
Pt called in asking for a refill on the Amlodipine, she is out of this medication. She uses [facility] in [place].   Please advise

## 2021-03-12 ENCOUNTER — Other Ambulatory Visit: Payer: Self-pay | Admitting: Family Medicine

## 2021-04-15 ENCOUNTER — Encounter: Payer: Self-pay | Admitting: Family Medicine

## 2021-04-15 ENCOUNTER — Other Ambulatory Visit: Payer: Self-pay

## 2021-04-15 ENCOUNTER — Ambulatory Visit (INDEPENDENT_AMBULATORY_CARE_PROVIDER_SITE_OTHER): Payer: Medicare Other | Admitting: Family Medicine

## 2021-04-15 VITALS — BP 121/70 | HR 68 | Temp 98.8°F | Resp 18 | Ht 60.0 in | Wt 103.2 lb

## 2021-04-15 DIAGNOSIS — K219 Gastro-esophageal reflux disease without esophagitis: Secondary | ICD-10-CM | POA: Diagnosis not present

## 2021-04-15 DIAGNOSIS — E079 Disorder of thyroid, unspecified: Secondary | ICD-10-CM | POA: Diagnosis not present

## 2021-04-15 DIAGNOSIS — I10 Essential (primary) hypertension: Secondary | ICD-10-CM | POA: Diagnosis not present

## 2021-04-15 LAB — HEPATIC FUNCTION PANEL
ALT: 16 U/L (ref 0–35)
AST: 17 U/L (ref 0–37)
Albumin: 4.5 g/dL (ref 3.5–5.2)
Alkaline Phosphatase: 42 U/L (ref 39–117)
Bilirubin, Direct: 0.1 mg/dL (ref 0.0–0.3)
Total Bilirubin: 0.5 mg/dL (ref 0.2–1.2)
Total Protein: 7 g/dL (ref 6.0–8.3)

## 2021-04-15 LAB — CBC WITH DIFFERENTIAL/PLATELET
Basophils Absolute: 0 10*3/uL (ref 0.0–0.1)
Basophils Relative: 0.6 % (ref 0.0–3.0)
Eosinophils Absolute: 0 10*3/uL (ref 0.0–0.7)
Eosinophils Relative: 0.4 % (ref 0.0–5.0)
HCT: 37.2 % (ref 36.0–46.0)
Hemoglobin: 12.4 g/dL (ref 12.0–15.0)
Lymphocytes Relative: 16 % (ref 12.0–46.0)
Lymphs Abs: 1 10*3/uL (ref 0.7–4.0)
MCHC: 33.3 g/dL (ref 30.0–36.0)
MCV: 91.3 fl (ref 78.0–100.0)
Monocytes Absolute: 0.4 10*3/uL (ref 0.1–1.0)
Monocytes Relative: 7.2 % (ref 3.0–12.0)
Neutro Abs: 4.7 10*3/uL (ref 1.4–7.7)
Neutrophils Relative %: 75.8 % (ref 43.0–77.0)
Platelets: 223 10*3/uL (ref 150.0–400.0)
RBC: 4.07 Mil/uL (ref 3.87–5.11)
RDW: 12.3 % (ref 11.5–15.5)
WBC: 6.1 10*3/uL (ref 4.0–10.5)

## 2021-04-15 LAB — TSH: TSH: 2.87 u[IU]/mL (ref 0.35–4.50)

## 2021-04-15 LAB — BASIC METABOLIC PANEL
BUN: 21 mg/dL (ref 6–23)
CO2: 27 mEq/L (ref 19–32)
Calcium: 9.5 mg/dL (ref 8.4–10.5)
Chloride: 102 mEq/L (ref 96–112)
Creatinine, Ser: 0.89 mg/dL (ref 0.40–1.20)
GFR: 64.63 mL/min (ref >60.00)
Glucose, Bld: 92 mg/dL (ref 70–99)
Potassium: 4.4 mEq/L (ref 3.5–5.1)
Sodium: 139 mEq/L (ref 135–145)

## 2021-04-15 LAB — LIPID PANEL
Cholesterol: 191 mg/dL (ref 0–200)
HDL: 80.9 mg/dL (ref >39.00)
LDL Cholesterol: 100 mg/dL — ABNORMAL HIGH (ref 0–99)
NonHDL: 109.95
Total CHOL/HDL Ratio: 2
Triglycerides: 52 mg/dL (ref 0.0–149.0)
VLDL: 10.4 mg/dL (ref 0.0–40.0)

## 2021-04-15 MED ORDER — PANTOPRAZOLE SODIUM 40 MG PO TBEC
1.0000 | DELAYED_RELEASE_TABLET | Freq: Every day | ORAL | 0 refills | Status: DC
Start: 2021-04-15 — End: 2021-07-15

## 2021-04-15 MED ORDER — AMLODIPINE BESYLATE 5 MG PO TABS
ORAL_TABLET | ORAL | 0 refills | Status: DC
Start: 2021-04-15 — End: 2021-07-28

## 2021-04-15 NOTE — Progress Notes (Signed)
   Subjective:    Patient ID: Glenda Rios, female    DOB: [birth year], 75 y.o.   MRN: [id number]  HPI HTN- chronic problem, on Amlodipine 5mg  daily w/ good control.  Denies CP, SOB, HAs, visual changes, edema.  Exercising regularly.  Hypothyroid- chronic problem, stopped her thyroid medication at last visit.  She reports being 'just a touch tired'.  Palpitations stopped when she stopped the thyroid medication.  GERD- chronic problem, on Protonix daily w/ good control.  Denies breakthrough sxs.  She is able to eat a bit later in the evening.   Review of Systems For ROS see HPI   This visit occurred during the SARS-CoV-2 public health emergency.  Safety protocols were in place, including screening questions prior to the visit, additional usage of staff PPE, and extensive cleaning of exam room while observing appropriate contact time as indicated for disinfecting solutions.       Objective:   Physical Exam Vitals reviewed.  Constitutional:      General: She is not in acute distress.    Appearance: Normal appearance. She is well-developed. She is not ill-appearing.  HENT:     Head: Normocephalic and atraumatic.  Eyes:     Conjunctiva/sclera: Conjunctivae normal.     Pupils: Pupils are equal, round, and reactive to light.  Neck:     Thyroid: No thyromegaly.  Cardiovascular:     Rate and Rhythm: Normal rate and regular rhythm.     Pulses: Normal pulses.     Heart sounds: Normal heart sounds. No murmur heard.   Pulmonary:     Effort: Pulmonary effort is normal. No respiratory distress.     Breath sounds: Normal breath sounds.  Abdominal:     General: There is no distension.     Palpations: Abdomen is soft.     Tenderness: There is no abdominal tenderness.  Musculoskeletal:     Cervical back: Normal range of motion and neck supple.     Right lower leg: No edema.     Left lower leg: No edema.  Lymphadenopathy:     Cervical: No cervical adenopathy.  Skin:    General: Skin  is warm and dry.  Neurological:     General: No focal deficit present.     Mental Status: She is alert and oriented to person, place, and time.  Psychiatric:        Mood and Affect: Mood normal.        Behavior: Behavior normal.        Thought Content: Thought content normal.           Assessment & Plan:

## 2021-04-15 NOTE — Patient Instructions (Signed)
Follow up in 6 months to recheck BP and thyroid We'll notify you of your lab results and make any changes if needed Keep up the good work!  You look great! Call with any questions or concerns Stay Safe!  Stay Healthy! Have a great summer!!!

## 2021-04-15 NOTE — Assessment & Plan Note (Signed)
Chronic problem.  Currently well controlled on Protonix.  Refill provided.

## 2021-04-15 NOTE — Assessment & Plan Note (Signed)
Chronic problem.  Not currently on Levothyroxine and does report feeling somewhat tired.  Palpitations did stop after stopping the Levothyroxine.  Check labs.  Restart meds prn

## 2021-04-15 NOTE — Assessment & Plan Note (Signed)
Chronic problem.  Excellent control on Amlodipine.  Currently asymptomatic.  No med changes at this time.

## 2021-05-07 ENCOUNTER — Encounter: Payer: Self-pay | Admitting: *Deleted

## 2021-05-26 ENCOUNTER — Ambulatory Visit (INDEPENDENT_AMBULATORY_CARE_PROVIDER_SITE_OTHER): Payer: Medicare Other | Admitting: *Deleted

## 2021-05-26 DIAGNOSIS — Z1211 Encounter for screening for malignant neoplasm of colon: Secondary | ICD-10-CM

## 2021-05-26 DIAGNOSIS — Z0000 Encounter for general adult medical examination without abnormal findings: Secondary | ICD-10-CM

## 2021-05-26 NOTE — Patient Instructions (Signed)
Glenda Rios , Thank you for taking time to come for your Medicare Wellness Visit. I appreciate your ongoing commitment to your health goals. Please review the following plan we discussed and let me know if I can assist you in the future.   Screening recommendations/referrals: Colonoscopy: Education Provided Mammogram: up to date Bone Density: up to date Recommended yearly ophthalmology/optometry visit for glaucoma screening and checkup Recommended yearly dental visit for hygiene and checkup  Vaccinations: Influenza vaccine: up to date Pneumococcal vaccine: up to date Tdap vaccine: education provided Shingles vaccine: education provided    Advanced directives: copy requested  Conditions/risks identified: na     Preventive Care 73 Years and Older, Female Preventive care refers to lifestyle choices and visits with your health care provider that can promote health and wellness. What does preventive care include? A yearly physical exam. This is also called an annual well check. Dental exams once or twice a year. Routine eye exams. Ask your health care provider how often you should have your eyes checked. Personal lifestyle choices, including: Daily care of your teeth and gums. Regular physical activity. Eating a healthy diet. Avoiding tobacco and drug use. Limiting alcohol use. Practicing safe sex. Taking low-dose aspirin every day. Taking vitamin and mineral supplements as recommended by your health care provider. What happens during an annual well check? The services and screenings done by your health care provider during your annual well check will depend on your age, overall health, lifestyle risk factors, and family history of disease. Counseling  Your health care provider may ask you questions about your: Alcohol use. Tobacco use. Drug use. Emotional well-being. Home and relationship well-being. Sexual activity. Eating habits. History of falls. Memory and ability to  understand (cognition). Work and work Statistician. Reproductive health. Screening  You may have the following tests or measurements: Height, weight, and BMI. Blood pressure. Lipid and cholesterol levels. These may be checked every 5 years, or more frequently if you are over 15 years old. Skin check. Lung cancer screening. You may have this screening every year starting at age 60 if you have a 30-pack-year history of smoking and currently smoke or have quit within the past 15 years. Fecal occult blood test (FOBT) of the stool. You may have this test every year starting at age 53. Flexible sigmoidoscopy or colonoscopy. You may have a sigmoidoscopy every 5 years or a colonoscopy every 10 years starting at age 73. Hepatitis C blood test. Hepatitis B blood test. Sexually transmitted disease (STD) testing. Diabetes screening. This is done by checking your blood sugar (glucose) after you have not eaten for a while (fasting). You may have this done every 1-3 years. Bone density scan. This is done to screen for osteoporosis. You may have this done starting at age 78. Mammogram. This may be done every 1-2 years. Talk to your health care provider about how often you should have regular mammograms. Talk with your health care provider about your test results, treatment options, and if necessary, the need for more tests. Vaccines  Your health care provider may recommend certain vaccines, such as: Influenza vaccine. This is recommended every year. Tetanus, diphtheria, and acellular pertussis (Tdap, Td) vaccine. You may need a Td booster every 10 years. Zoster vaccine. You may need this after age 67. Pneumococcal 13-valent conjugate (PCV13) vaccine. One dose is recommended after age 73. Pneumococcal polysaccharide (PPSV23) vaccine. One dose is recommended after age 73. Talk to your health care provider about which screenings and vaccines you need  and how often you need them. This information is not  intended to replace advice given to you by your health care provider. Make sure you discuss any questions you have with your health care provider. Document Released: 11/22/2015 Document Revised: 07/15/2016 Document Reviewed: 08/27/2015 Elsevier Interactive Patient Education  2017 [employer] Prevention in the Home Falls can cause injuries. They can happen to people of all ages. There are many things you can do to make your home safe and to help prevent falls. What can I do on the outside of my home? Regularly fix the edges of walkways and driveways and fix any cracks. Remove anything that might make you trip as you walk through a door, such as a raised step or threshold. Trim any bushes or trees on the path to your home. Use bright outdoor lighting. Clear any walking paths of anything that might make someone trip, such as rocks or tools. Regularly check to see if handrails are loose or broken. Make sure that both sides of any steps have handrails. Any raised decks and porches should have guardrails on the edges. Have any leaves, snow, or ice cleared regularly. Use sand or salt on walking paths during winter. Clean up any spills in your garage right away. This includes oil or grease spills. What can I do in the bathroom? Use night lights. Install grab bars by the toilet and in the tub and shower. Do not use towel bars as grab bars. Use non-skid mats or decals in the tub or shower. If you need to sit down in the shower, use a plastic, non-slip stool. Keep the floor dry. Clean up any water that spills on the floor as soon as it happens. Remove soap buildup in the tub or shower regularly. Attach bath mats securely with double-sided non-slip rug tape. Do not have throw rugs and other things on the floor that can make you trip. What can I do in the bedroom? Use night lights. Make sure that you have a light by your bed that is easy to reach. Do not use any sheets or blankets that are  too big for your bed. They should not hang down onto the floor. Have a firm chair that has side arms. You can use this for support while you get dressed. Do not have throw rugs and other things on the floor that can make you trip. What can I do in the kitchen? Clean up any spills right away. Avoid walking on wet floors. Keep items that you use a lot in easy-to-reach places. If you need to reach something above you, use a strong step stool that has a grab bar. Keep electrical cords out of the way. Do not use floor polish or wax that makes floors slippery. If you must use wax, use non-skid floor wax. Do not have throw rugs and other things on the floor that can make you trip. What can I do with my stairs? Do not leave any items on the stairs. Make sure that there are handrails on both sides of the stairs and use them. Fix handrails that are broken or loose. Make sure that handrails are as long as the stairways. Check any carpeting to make sure that it is firmly attached to the stairs. Fix any carpet that is loose or worn. Avoid having throw rugs at the top or bottom of the stairs. If you do have throw rugs, attach them to the floor with carpet tape. Make sure that you  have a light switch at the top of the stairs and the bottom of the stairs. If you do not have them, ask someone to add them for you. What else can I do to help prevent falls? Wear shoes that: Do not have high heels. Have rubber bottoms. Are comfortable and fit you well. Are closed at the toe. Do not wear sandals. If you use a stepladder: Make sure that it is fully opened. Do not climb a closed stepladder. Make sure that both sides of the stepladder are locked into place. Ask someone to hold it for you, if possible. Clearly mark and make sure that you can see: Any grab bars or handrails. First and last steps. Where the edge of each step is. Use tools that help you move around (mobility aids) if they are needed. These  include: Canes. Walkers. Scooters. Crutches. Turn on the lights when you go into a dark area. Replace any light bulbs as soon as they burn out. Set up your furniture so you have a clear path. Avoid moving your furniture around. If any of your floors are uneven, fix them. If there are any pets around you, be aware of where they are. Review your medicines with your doctor. Some medicines can make you feel dizzy. This can increase your chance of falling. Ask your doctor what other things that you can do to help prevent falls. This information is not intended to replace advice given to you by your health care provider. Make sure you discuss any questions you have with your health care provider. Document Released: 08/22/2009 Document Revised: 04/02/2016 Document Reviewed: 11/30/2014 Elsevier Interactive Patient Education  2017 [employer].

## 2021-05-26 NOTE — Progress Notes (Signed)
Subjective:   Glenda Rios is a 75 y.o. female who presents for Medicare Annual (Subsequent) preventive examination.  I connected with  Glenda Rios on 05/26/21 by a telephone enabled telemedicine application and verified that I am speaking with the correct person using two identifiers.   I discussed the limitations of evaluation and management by telemedicine. The patient expressed understanding and agreed to proceed.   Review of Systems    NA Cardiac Risk Factors include: advanced age (>65men, >34 women);hypertension     Objective:    Today's Vitals   There is no height or weight on file to calculate BMI.  Advanced Directives 05/26/2021 05/14/2020 04/13/2019  Does Patient Have a Medical Advance Directive? Yes Yes Yes  Type of Paramedic of [place];Living will Living will;Healthcare Power of Attorney Living will;Healthcare Power of [place] in Chart? No - copy requested No - copy requested No - copy requested    Current Medications (verified) Outpatient Encounter Medications as of 05/26/2021  Medication Sig   ALOE PO Take 300 mg by mouth daily.   amLODipine (NORVASC) 5 MG tablet Take 1 tab by mouth daily.   calcium citrate-vitamin D (CITRACAL+D) 315-200 MG-UNIT tablet Take 1 tablet by mouth 2 (two) times daily.   Coenzyme Q10 (CO Q10) 60 MG CAPS Take 1 capsule by mouth daily.   Cranberry 600 MG TABS Take 1 tablet by mouth daily.   Cyanocobalamin (TH VITAMIN B-12 PO) Take 1 tablet by mouth daily.   Lysine 500 MG CAPS Take 1 capsule by mouth daily.   oxybutynin (DITROPAN-XL) 10 MG 24 hr tablet Take 10 mg by mouth daily.   pantoprazole (PROTONIX) 40 MG tablet Take 1 tablet (40 mg total) by mouth daily.   Probiotic Product (PROBIOTIC-10) CAPS Take 1 capsule by mouth daily.   SALINE NASAL SPRAY NA Place into the nose 2 (two) times a day.   levothyroxine (SYNTHROID) 50 MCG tablet Take 1 tablet by mouth once daily  (Patient not taking: No sig reported)   tiZANidine (ZANAFLEX) 4 MG tablet Take 1 tablet (4 mg total) by mouth at bedtime. (Patient not taking: No sig reported)   No facility-administered encounter medications on file as of 05/26/2021.    Allergies (verified) Iohexol and Codeine   History: Past Medical History:  Diagnosis Date   Allergy    Arthritis    Colon polyps    GERD (gastroesophageal reflux disease)    History of chicken pox    Hypertension    Thyroid disease    Past Surgical History:  Procedure Laterality Date   APPENDECTOMY  1995   BACK SURGERY  2007, 2012   CHOLECYSTECTOMY  1995   Family History  Problem Relation Age of Onset   Breast cancer Maternal Aunt    Hypertension Mother    Stroke Mother    Cancer Mother        Bone Cancer   Diabetes Father    Heart disease Sister    Alcohol abuse Sister    Heart disease Sister    Heart disease Sister    Rheumatic fever Sister    Social History   Socioeconomic History   Marital status: Widowed    Spouse name: Glenda Rios   Number of children: Not on file   Years of education: Not on file   Highest education level: Not on file  Occupational History   Not on file  Tobacco Use   Smoking  status: Never   Smokeless tobacco: Never  Vaping Use   Vaping Use: Never used  Substance and Sexual Activity   Alcohol use: Not Currently   Drug use: Not Currently   Sexual activity: Not Currently  Other Topics Concern   Not on file  Social History Narrative   Not on file   Social Determinants of Health   Financial Resource Strain: Low Risk    Difficulty of Paying Living Expenses: Not hard at all  Food Insecurity: No Food Insecurity   Worried About Charity fundraiser in the Last Year: Never true   [place] in the Last Year: Never true  Transportation Needs: No Transportation Needs   Lack of Transportation (Medical): No   Lack of Transportation (Non-Medical): No  Physical Activity: Sufficiently Active   Days of  Exercise per Week: 4 days   Minutes of Exercise per Session: 40 min  Stress: No Stress Concern Present   Feeling of Stress : Not at all  Social Connections: Moderately Integrated   Frequency of Communication with Friends and Family: More than three times a week   Frequency of Social Gatherings with Friends and Family: More than three times a week   Attends Religious Services: More than 4 times per year   Active Member of [employer] or Organizations: Yes   Attends Archivist Meetings: 1 to 4 times per year   Marital Status: Widowed    Tobacco Counseling Counseling given: Not Answered   Clinical Intake:  Pre-visit preparation completed: No  Pain : No/denies pain     Nutritional Risks: None Diabetes: No  How often do you need to have someone help you when you read instructions, pamphlets, or other written materials from your doctor or pharmacy?: 1 - Never  Diabetic?  NO     Information entered by :: Leroy Kennedy LPN   Activities of Daily Living In your present state of health, do you have any difficulty performing the following activities: 05/26/2021 04/15/2021  Hearing? N N  Vision? N N  Difficulty concentrating or making decisions? N N  Walking or climbing stairs? N N  Dressing or bathing? N N  Doing errands, shopping? N N  Preparing Food and eating ? N -  Using the Toilet? N -  In the past six months, have you accidently leaked urine? N -  Do you have problems with loss of bowel control? N -  Managing your Medications? N -  Managing your Finances? N -  Housekeeping or managing your Housekeeping? N -  Some recent data might be hidden    Patient Care Team: Midge Minium, MD as PCP - General (Family Medicine) Wonda Horner, MD as Consulting Physician (Gastroenterology) Luberta Mutter, MD as Consulting Physician (Ophthalmology) Gavin Pound, MD as Consulting Physician (Rheumatology) Thurnell Lose, MD as Consulting Physician (Obstetrics and  Gynecology) Franchot Gallo, MD as Consulting Physician (Urology)  Indicate any recent Medical Services you may have received from other than [facility] providers in the past year (date may be approximate).     Assessment:   This is a routine wellness examination for Glenda Rios.  Hearing/Vision screen Hearing Screening - Comments:: No trouble hearing Vision Screening - Comments:: Dr. Ginnie Smart Opthalmology Will call to schedule  Dietary issues and exercise activities discussed: Current Exercise Habits: Home exercise routine, Type of exercise: walking;Other - see comments (stationary bike), Time (Minutes): 40, Frequency (Times/Week): 4, Weekly Exercise (Minutes/Week): 160, Intensity: Moderate   Goals  Addressed             This Visit's Progress    Patient Stated   On track    Maintain current health by staying active.       Patient Stated       Patient would like to bike on her stationary bike be able to go over a mile       Depression Screen PHQ 2/9 Scores 05/26/2021 05/26/2021 04/15/2021 05/30/2020 05/14/2020 08/24/2019 04/13/2019  PHQ - 2 Score 0 0 0 2 0 0 0  PHQ- 9 Score - - 0 2 - 0 -    Fall Risk Fall Risk  05/26/2021 04/15/2021 05/30/2020 05/14/2020 08/24/2019  Falls in the past year? 0 0 0 0 0  Number falls in past yr: 0 0 0 0 0  Injury with Fall? 0 0 0 0 0  Risk for fall due to : - No Fall Risks - - -  Follow up Falls evaluation completed;Falls prevention discussed - Falls evaluation completed Falls prevention discussed Falls evaluation completed    FALL RISK PREVENTION PERTAINING TO THE HOME:  Any stairs in or around the home? No  If so, are there any without handrails? No  Home free of loose throw rugs in walkways, pet beds, electrical cords, etc? Yes  Adequate lighting in your home to reduce risk of falls? Yes   ASSISTIVE DEVICES UTILIZED TO PREVENT FALLS:  Life alert? No  Use of a cane, walker or w/c? No  Grab bars in the bathroom? No  Shower chair or bench  in shower? No  Elevated toilet seat or a handicapped toilet? No   TIMED UP AND GO:  Was the test performed? No .   Tele -Health  Cognitive Function: MMSE - Mini Mental State Exam 04/13/2019  Not completed: Unable to complete        Immunizations Immunization History  Administered Date(s) Administered   Fluad Quad(high Dose 65+) 08/24/2019   H1N1 08/30/2017   Influenza, High Dose Seasonal PF 08/31/2007   Influenza,inj,Quad PF,6+ Mos 07/28/2018   Moderna SARS-COV2 Booster Vaccination 09/26/2020   Moderna Sars-Covid-2 Vaccination 12/14/2019, 01/12/2020   Pneumococcal Conjugate-13 01/15/2015   Pneumococcal Polysaccharide-23 02/17/2017   Tdap 08/31/2007    TDAP status: Due, Education has been provided regarding the importance of this vaccine. Advised may receive this vaccine at local pharmacy or Health Dept. Aware to provide a copy of the vaccination record if obtained from local pharmacy or Health Dept. Verbalized acceptance and understanding.  Flu Vaccine status: Up to date  Pneumococcal vaccine status: Up to date  Covid-19 vaccine status: Information provided on how to obtain vaccines.   Qualifies for Shingles Vaccine? Yes   Zostavax completed Yes   Shingrix Completed?: No.    Education has been provided regarding the importance of this vaccine. Patient has been advised to call insurance company to determine out of pocket expense if they have not yet received this vaccine. Advised may also receive vaccine at local pharmacy or Health Dept. Verbalized acceptance and understanding.  Screening Tests Health Maintenance  Topic Date Due   Zoster Vaccines- Shingrix (1 of 2) Never done   TETANUS/TDAP  08/30/2017   COVID-19 Vaccine (4 - Booster for Moderna series) 12/27/2020   Hepatitis C Screening  05/30/2021 (Originally 07/19/1964)   INFLUENZA VACCINE  06/09/2021   COLONOSCOPY (Pts 45-34yrs Insurance coverage will need to be confirmed)  07/22/2021   MAMMOGRAM  08/26/2022   DEXA  SCAN  Completed  PNA vac Low Risk Adult  Completed   HPV VACCINES  Aged Out    Health Maintenance  Health Maintenance Due  Topic Date Due   Zoster Vaccines- Shingrix (1 of 2) Never done   TETANUS/TDAP  08/30/2017   COVID-19 Vaccine (4 - Booster for Moderna series) 12/27/2020    Colorectal cancer screening: Referral to GI placed  . Pt aware the office will call re: appt.  Mammogram status: Completed  . Repeat every year  Bone Density status: Completed  . Results reflect: Bone density results: OSTEOPOROSIS. Repeat every 2 years.  Lung Cancer Screening: (Low Dose CT Chest recommended if Age 33-80 years, 30 pack-year currently smoking OR have quit w/in 15years.) does not qualify.   Lung Cancer Screening Referral: na  Additional Screening:  Hepatitis C Screening: does qualify;  Vision Screening: Recommended annual ophthalmology exams for early detection of glaucoma and other disorders of the eye. Is the patient up to date with their annual eye exam?  No  Who is the provider or what is the name of the office in which the patient attends annual eye exams? [facility] If pt is not established with a provider, would they like to be referred to a provider to establish care?  established .   Dental Screening: Recommended annual dental exams for proper oral hygiene  Community Resource Referral / Chronic Care Management: CRR required this visit?  No   CCM required this visit?  No      Plan:     I have personally reviewed and noted the following in the patient's chart:   Medical and social history Use of alcohol, tobacco or illicit drugs  Current medications and supplements including opioid prescriptions.  Functional ability and status Nutritional status Physical activity Advanced directives List of other physicians Hospitalizations, surgeries, and ER visits in previous 12 months Vitals Screenings to include cognitive, depression, and falls Referrals and  appointments  In addition, I have reviewed and discussed with patient certain preventive protocols, quality metrics, and best practice recommendations. A written personalized care plan for preventive services as well as general preventive health recommendations were provided to patient.     Leroy Kennedy, LPN   [id number]/54/4920   Nurse Notes: na

## 2021-06-26 ENCOUNTER — Telehealth: Payer: Self-pay | Admitting: Family Medicine

## 2021-06-26 NOTE — Chronic Care Management (AMB) (Signed)
  Chronic Care Management   Outreach Note  06/26/2021 Name: Glenda Rios MRN: [id number] DOB: 30-Sep-1946  Referred by: Midge Minium, MD Reason for referral : No chief complaint on file.   An unsuccessful telephone outreach was attempted today. The patient was referred to the pharmacist for assistance with care management and care coordination.   Follow Up Plan:   Tatjana Dellinger Upstream Scheduler

## 2021-06-30 ENCOUNTER — Telehealth: Payer: Self-pay | Admitting: Family Medicine

## 2021-06-30 NOTE — Progress Notes (Signed)
  Chronic Care Management   Note  06/30/2021 Name: Glenda Rios MRN: [id number] DOB: [birth year]  Glenda Rios is a 75 y.o. year old female who is a primary care patient of Birdie Riddle, Aundra Millet, MD. I reached out to Glenda Rios by phone today in response to a referral sent by Ms. Leslie Dales PCP, Midge Minium, MD.   [name] was given information about Chronic Care Management services today including:  CCM service includes personalized support from designated clinical staff supervised by her physician, including individualized plan of care and coordination with other care providers 24/7 contact phone numbers for assistance for urgent and routine care needs. Service will only be billed when office clinical staff spend 20 minutes or more in a month to coordinate care. Only one practitioner may furnish and bill the service in a calendar month. The patient may stop CCM services at any time (effective at the end of the month) by phone call to the office staff.   Patient agreed to services and verbal consent obtained.   Follow up plan:   Tatjana Secretary/administrator

## 2021-07-15 ENCOUNTER — Other Ambulatory Visit: Payer: Self-pay | Admitting: Family Medicine

## 2021-07-28 ENCOUNTER — Other Ambulatory Visit: Payer: Self-pay | Admitting: Family Medicine

## 2021-08-07 ENCOUNTER — Other Ambulatory Visit: Payer: Self-pay | Admitting: Family Medicine

## 2021-08-07 DIAGNOSIS — Z1231 Encounter for screening mammogram for malignant neoplasm of breast: Secondary | ICD-10-CM

## 2021-09-02 ENCOUNTER — Telehealth: Payer: Self-pay

## 2021-09-02 NOTE — Progress Notes (Signed)
Chronic Care Management Pharmacy Assistant   Name: Glenda Rios  MRN: [id number] DOB: [birth year]  Glenda Rios is an 75 y.o. year old female who presents for her initial CCM visit with the clinical pharmacist.  Reason for Encounter: Chart Prep for Initial CPP visit on 09/03/21 @ 2:00 pm    Recent office visits:  05/26/21 Medicare Annual Wellness Completed.  04/15/21 Annye Asa, MD (PCP) - Family Medicine - Thyroid disease - Labs were ordered. No medication changes. Follow up in 6 months.   Recent consult visits:  None noted.   Hospital visits:  None in previous 6 months  Medications: Outpatient Encounter Medications as of 09/02/2021  Medication Sig Note   ALOE PO Take 300 mg by mouth daily.    amLODipine (NORVASC) 5 MG tablet TAKE 1 TABLET BY MOUTH ONCE DAILY . APPOINTMENT REQUIRED FOR FUTURE REFILLS    calcium citrate-vitamin D (CITRACAL+D) 315-200 MG-UNIT tablet Take 1 tablet by mouth 2 (two) times daily.    Coenzyme Q10 (CO Q10) 60 MG CAPS Take 1 capsule by mouth daily.    Cranberry 600 MG TABS Take 1 tablet by mouth daily.    Cyanocobalamin (TH VITAMIN B-12 PO) Take 1 tablet by mouth daily.    levothyroxine (SYNTHROID) 50 MCG tablet Take 1 tablet by mouth once daily (Patient not taking: No sig reported)    Lysine 500 MG CAPS Take 1 capsule by mouth daily.    oxybutynin (DITROPAN-XL) 10 MG 24 hr tablet Take 10 mg by mouth daily. 04/13/2019: Every other day per urologist    pantoprazole (PROTONIX) 40 MG tablet TAKE 1 TABLET BY MOUTH ONCE DAILY . APPOINTMENT REQUIRED FOR FUTURE REFILLS    Probiotic Product (PROBIOTIC-10) CAPS Take 1 capsule by mouth daily.    SALINE NASAL SPRAY NA Place into the nose 2 (two) times a day.    tiZANidine (ZANAFLEX) 4 MG tablet Take 1 tablet (4 mg total) by mouth at bedtime. (Patient not taking: No sig reported)    No facility-administered encounter medications on file as of 09/02/2021.    Have you seen any other providers since  your last visit? no  Any changes in your medications or health? No. Patient reported she is getting over [place] currently.   Any side effects from any medications? no  Do you have an symptoms or problems not managed by your medications? no  Any concerns about your health right now? No. Patient reported she is just getting over [place].   Has your provider asked that you check blood pressure, blood sugar, or follow special diet at home? Yes. Patient reports she checks her blood pressures at home. Her last blood pressure reading was 119/76. She has been coping with the loss of her husband in 2021.   Do you get any type of exercise on a regular basis? Yes. Patient reports she uses an exercise bike at home and walks a lot at home.   Can you think of a goal you would like to reach for your health? Patient reported she would like to increase to 2 miles a day from 1 daily.   Do you have any problems getting your medications? no  Is there anything that you would like to discuss during the appointment? Patient reported she is currently off one of her medications and would like to see if this is something she could stay off of or would need to go back on.   Please bring medications and supplements to appointment.  Patient confirmed appointment date and time.    Care Gaps  AWV: done 05/26/21 Colonoscopy: done 07/22/16 DM Eye Exam: N/A DM Foot Exam: N/A Microalbumin: N/A HbgAIC: N/A DEXA: done 08/26/20 Mammogram: scheduled 10/01/21  Star Rating Drugs: No Star Rating Drugs noted.   Future Appointments  Date Time Provider [place]  09/03/2021  2:00 PM [facility] CCM PHARMACIST [facility]  10/01/2021  2:40 PM [facility] MM 2 [facility]   Jobe Gibbon, CCMA Clinical Pharmacist Assistant  [phone]  Time Spent: 38 minutes

## 2021-09-03 ENCOUNTER — Ambulatory Visit (INDEPENDENT_AMBULATORY_CARE_PROVIDER_SITE_OTHER): Payer: Medicare Other

## 2021-09-03 DIAGNOSIS — E079 Disorder of thyroid, unspecified: Secondary | ICD-10-CM

## 2021-09-03 DIAGNOSIS — I10 Essential (primary) hypertension: Secondary | ICD-10-CM

## 2021-09-03 DIAGNOSIS — M810 Age-related osteoporosis without current pathological fracture: Secondary | ICD-10-CM

## 2021-09-03 NOTE — Patient Instructions (Signed)
Glenda Rios,  Thank you for talking with me today. I have included our care plan/goals in the following pages.   Please review and call me at [phone] with any questions.  Thanks! Ellin Mayhew, PharmD, CPP Clinical Pharmacist Practitioner  [phone]  Care Plan : ccm pharmacy care plan  Updates made by Madelin Rear, [facility] since 09/03/2021 12:00 AM     Problem: HTN Osteoporosis      Long-Range Goal: disease management   Start Date: 09/03/2021  Expected End Date: 09/03/2022  This Visit's Progress: On track  Priority: High  Note:   Current Barriers:  Unable to achieve control of Osteoporosis   Pharmacist Clinical Goal(s):  Patient will verbalize ability to afford treatment regimen achieve adherence to monitoring guidelines and medication adherence to achieve therapeutic efficacy through collaboration with PharmD and provider.   Interventions: 1:1 collaboration with Midge Minium, MD regarding development and update of comprehensive plan of care as evidenced by provider attestation and co-signature Inter-disciplinary care team collaboration (see longitudinal plan of care) Comprehensive medication review performed; medication list updated in electronic medical record  Hypertension (BP goal <130/80) -Controlled -Current treatment: Amlodipine 5 mg once daily  -Medications previously tried: n/a  -Current home readings: 127/63, 119/76 (unknown date) -Current dietary habits: portion control, minimal processed foods.  -Current exercise habits: exercise bike, walking.  -Denies hypotensive/hypertensive symptoms -Educated on BP goals and benefits of medications for prevention of heart attack, stroke and kidney damage; -Counseled to monitor BP at home 1-2x/wk, document, and provide log at future appointments -Recommended to continue current medication  Osteoporosis (Goal prevent practures) -Controlled -Did not have good experience with reflux and  bisphosphonates  -Last DEXA Scan: 08/26/2020 - osteoporosis  -Patient is a candidate for pharmacologic treatment due to T-Score < -2.5 in lumbar spine -Current treatment  Vitamin D3-Calcium / Bone builder supplement  -Medications previously tried: prolia (bone pain), oral bisphosphonates (GERD)  -Recommend [phone] units of vitamin D daily. Recommend 1200 mg of calcium daily from dietary and supplemental sources. Recommend weight-bearing and muscle strengthening exercises for building and maintaining bone density. Consider bisphos infusion - declined at this time. Reviewed side effect concerns, understands this would minimize reflux. CPA check in 1 month, otherwise repeat dexa 08/2022   -Recommended to continue current medication  Hypothyroidism (goal maintain TSH) -Has had trial off of levothyroxine due to weight loss -Current therapy: No current medications -Previous tx: levothyroxine 50 mcg, 75 mcg  -denies any changes or symptoms after stopping the levothyroxine  -No rx changes  Patient Goals/Self-Care Activities Patient will:  - take medications as prescribed  Medication Assistance: None at this time.   Patient's preferred pharmacy is:  [facility] [address] [place] [address] [place] [address] Phone: [phone] Fax: [phone]   Pt endorses 100% compliance  Follow Up:  Patient agrees to Care Plan and Follow-up. Plan: HC 1 month pt call to check if patient would like to start on IV version of bisphonate for osteoporosis + check on BP, diet/exercise. Pharmacist f/u 12 months.      The patient was given the following information about Chronic Care Management services today, agreed to services, and gave verbal consent: 1. CCM service includes personalized support from designated clinical staff supervised by the primary care provider, including individualized plan of care and coordination with other care providers 2. 24/7 contact phone  numbers for assistance for urgent and routine care needs. 3. Service  will only be billed when office clinical staff spend 20 minutes or more in a month to coordinate care. 4. Only one practitioner may furnish and bill the service in a calendar month. 5.The patient may stop CCM services at any time (effective at the end of the month) by phone call to the office staff. 6. The patient will be responsible for cost sharing (co-pay) of up to 20% of the service fee (after annual deductible is met). Patient agreed to services and consent obtained.  The patient verbalized understanding of instructions provided today and agreed to receive a MyChart copy of patient instruction and/or educational materials. Telephone follow up appointment with pharmacy team member scheduled for: See next appointment with "Care Management Staff" under "What's Next" below.  Hypertension, Adult High blood pressure (hypertension) is when the force of blood pumping through the arteries is too strong. The arteries are the blood vessels that carry blood from the heart throughout the body. Hypertension forces the heart to work harder to pump blood and may cause arteries to become narrow or stiff. Untreated or uncontrolled hypertension can cause a heart attack, heart failure, a stroke, kidney disease, and other problems. A blood pressure reading consists of a higher number over a lower number. Ideally, your blood pressure should be below 120/80. The first ("top") number is called the systolic pressure. It is a measure of the pressure in your arteries as your heart beats. The second ("bottom") number is called the diastolic pressure. It is a measure of the pressure in your arteries as the heart relaxes. What are the causes? The exact cause of this condition is not known. There are some conditions that result in or are related to high blood pressure. What increases the risk? Some risk factors for high blood pressure are under your control. The  following factors may make you more likely to develop this condition: Smoking. Having type 2 diabetes mellitus, high cholesterol, or both. Not getting enough exercise or physical activity. Being overweight. Having too much fat, sugar, calories, or salt (sodium) in your diet. Drinking too much alcohol. Some risk factors for high blood pressure may be difficult or impossible to change. Some of these factors include: Having chronic kidney disease. Having a family history of high blood pressure. Age. Risk increases with age. Race. You may be at higher risk if you are African American. Gender. Men are at higher risk than women before age 45. After age 65, women are at higher risk than men. Having obstructive sleep apnea. Stress. What are the signs or symptoms? High blood pressure may not cause symptoms. Very high blood pressure (hypertensive crisis) may cause: Headache. Anxiety. Shortness of breath. Nosebleed. Nausea and vomiting. Vision changes. Severe chest pain. Seizures. How is this diagnosed? This condition is diagnosed by measuring your blood pressure while you are seated, with your arm resting on a flat surface, your legs uncrossed, and your feet flat on the floor. The cuff of the blood pressure monitor will be placed directly against the skin of your upper arm at the level of your heart. It should be measured at least twice using the same arm. Certain conditions can cause a difference in blood pressure between your right and left arms. Certain factors can cause blood pressure readings to be lower or higher than normal for a short period of time: When your blood pressure is higher when you are in a health care provider's office than when you are at home, this is called white coat hypertension.   Most people with this condition do not need medicines. When your blood pressure is higher at home than when you are in a health care provider's office, this is called masked hypertension. Most  people with this condition may need medicines to control blood pressure. If you have a high blood pressure reading during one visit or you have normal blood pressure with other risk factors, you may be asked to: Return on a different day to have your blood pressure checked again. Monitor your blood pressure at home for 1 week or longer. If you are diagnosed with hypertension, you may have other blood or imaging tests to help your health care provider understand your overall risk for other conditions. How is this treated? This condition is treated by making healthy lifestyle changes, such as eating healthy foods, exercising more, and reducing your alcohol intake. Your health care provider may prescribe medicine if lifestyle changes are not enough to get your blood pressure under control, and if: Your systolic blood pressure is above 130. Your diastolic blood pressure is above 80. Your personal target blood pressure may vary depending on your medical conditions, your age, and other factors. Follow these instructions at home: Eating and drinking  Eat a diet that is high in fiber and potassium, and low in sodium, added sugar, and fat. An example eating plan is called the DASH (Dietary Approaches to Stop Hypertension) diet. To eat this way: Eat plenty of fresh fruits and vegetables. Try to fill one half of your plate at each meal with fruits and vegetables. Eat whole grains, such as whole-wheat pasta, brown rice, or whole-grain bread. Fill about one fourth of your plate with whole grains. Eat or drink low-fat dairy products, such as skim milk or low-fat yogurt. Avoid fatty cuts of meat, processed or cured meats, and poultry with skin. Fill about one fourth of your plate with lean proteins, such as fish, chicken without skin, beans, eggs, or tofu. Avoid pre-made and processed foods. These tend to be higher in sodium, added sugar, and fat. Reduce your daily sodium intake. Most people with hypertension  should eat less than 1,500 mg of sodium a day. Do not drink alcohol if: Your health care provider tells you not to drink. You are pregnant, may be pregnant, or are planning to become pregnant. If you drink alcohol: Limit how much you use to: 0-1 drink a day for women. 0-2 drinks a day for men. Be aware of how much alcohol is in your drink. In the [place] one drink equals one 12 oz bottle of beer (355 mL), one 5 oz glass of wine (148 mL), or one 1 oz glass of hard liquor (44 mL). Lifestyle  Work with your health care provider to maintain a healthy body weight or to lose weight. Ask what an ideal weight is for you. Get at least 30 minutes of exercise most days of the week. Activities may include walking, swimming, or biking. Include exercise to strengthen your muscles (resistance exercise), such as Pilates or lifting weights, as part of your weekly exercise routine. Try to do these types of exercises for 30 minutes at least 3 days a week. Do not use any products that contain nicotine or tobacco, such as cigarettes, e-cigarettes, and chewing tobacco. If you need help quitting, ask your health care provider. Monitor your blood pressure at home as told by your health care provider. Keep all follow-up visits as told by your health care provider. This is important. Medicines Take over-the-counter and  prescription medicines only as told by your health care provider. Follow directions carefully. Blood pressure medicines must be taken as prescribed. Do not skip doses of blood pressure medicine. Doing this puts you at risk for problems and can make the medicine less effective. Ask your health care provider about side effects or reactions to medicines that you should watch for. Contact a health care provider if you: Think you are having a reaction to a medicine you are taking. Have headaches that keep coming back (recurring). Feel dizzy. Have swelling in your ankles. Have trouble with your vision. Get  help right away if you: Develop a severe headache or confusion. Have unusual weakness or numbness. Feel faint. Have severe pain in your chest or abdomen. Vomit repeatedly. Have trouble breathing. Summary Hypertension is when the force of blood pumping through your arteries is too strong. If this condition is not controlled, it may put you at risk for serious complications. Your personal target blood pressure may vary depending on your medical conditions, your age, and other factors. For most people, a normal blood pressure is less than 120/80. Hypertension is treated with lifestyle changes, medicines, or a combination of both. Lifestyle changes include losing weight, eating a healthy, low-sodium diet, exercising more, and limiting alcohol. This information is not intended to replace advice given to you by your health care provider. Make sure you discuss any questions you have with your health care provider. Document Revised: 07/06/2018 Document Reviewed: 07/06/2018 Elsevier Patient Education  2022 [employer]  
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
## 2021-09-05 ENCOUNTER — Ambulatory Visit: Payer: Medicare Other

## 2021-09-06 DIAGNOSIS — U071 COVID-19: Secondary | ICD-10-CM | POA: Diagnosis not present

## 2021-09-08 DIAGNOSIS — I10 Essential (primary) hypertension: Secondary | ICD-10-CM | POA: Diagnosis not present

## 2021-09-08 DIAGNOSIS — M810 Age-related osteoporosis without current pathological fracture: Secondary | ICD-10-CM | POA: Diagnosis not present

## 2021-09-22 ENCOUNTER — Telehealth: Payer: Self-pay

## 2021-09-22 DIAGNOSIS — H35373 Puckering of macula, bilateral: Secondary | ICD-10-CM | POA: Diagnosis not present

## 2021-09-22 DIAGNOSIS — H5203 Hypermetropia, bilateral: Secondary | ICD-10-CM | POA: Diagnosis not present

## 2021-09-22 DIAGNOSIS — H25043 Posterior subcapsular polar age-related cataract, bilateral: Secondary | ICD-10-CM | POA: Diagnosis not present

## 2021-09-22 NOTE — Progress Notes (Signed)
    Chronic Care Management Pharmacy Assistant   Name: KORTNIE STOVALL  MRN: [id number] DOB: 04/07/1946   Reason for Encounter: Follow up [place] Phone Call    Per CPP follow up with patient for 1 month pt call to check if patient would like to start on IV version of bisphonate for osteoporosis + check on BP, diet/exercise. Pharmacist f/u 12 months. Remind pt to get f/u for thyroid/BP w/ pcp if she has not already.  Called patient and patient stated she is scheduled to have her thyroid checked in December. She stated she has been checking her BP and it has been fine and she no concerns with her blood pressures. Average is around 120/63 patient reported. Patient stated she has decided to hold off on the IV therapy for Osteoporosis and she is current taking an OTC supplement from the vitamin store for bone building and support and would like to wait until her next bone scan to make that decision and see if this makes any difference from her last one. She reported she is just getting over COVID and doing well. She plans to get her COVID booster in the next 1-2 weeks.   Patient has 1 year follow up visit scheduled with CPP already (see below).  Future Appointments  Date Time Provider [place]  10/01/2021  2:40 PM [facility] MM 2 [facility]  09/09/2022  3:00 PM [facility] CCM PHARMACIST [facility] [place], CCMA Clinical Pharmacist Assistant  [phone]  Time Spent: 19 minutes

## 2021-10-01 ENCOUNTER — Ambulatory Visit
Admission: RE | Admit: 2021-10-01 | Discharge: 2021-10-01 | Disposition: A | Discharge status: Home / Self Care | Payer: Medicare Other | Source: Ambulatory Visit | Attending: Family Medicine | Admitting: Family Medicine

## 2021-10-01 DIAGNOSIS — Z1231 Encounter for screening mammogram for malignant neoplasm of breast: Secondary | ICD-10-CM

## 2021-10-07 ENCOUNTER — Other Ambulatory Visit: Payer: Self-pay | Admitting: Family Medicine

## 2021-10-14 ENCOUNTER — Other Ambulatory Visit: Payer: Self-pay | Admitting: Family Medicine

## 2021-10-22 ENCOUNTER — Encounter: Payer: Self-pay | Admitting: Family Medicine

## 2021-10-22 ENCOUNTER — Ambulatory Visit (INDEPENDENT_AMBULATORY_CARE_PROVIDER_SITE_OTHER): Payer: Medicare Other | Admitting: Family Medicine

## 2021-10-22 VITALS — BP 132/70 | HR 87 | Temp 98.4°F | Resp 17 | Wt 108.6 lb

## 2021-10-22 DIAGNOSIS — I10 Essential (primary) hypertension: Secondary | ICD-10-CM

## 2021-10-22 DIAGNOSIS — Z23 Encounter for immunization: Secondary | ICD-10-CM

## 2021-10-22 DIAGNOSIS — E079 Disorder of thyroid, unspecified: Secondary | ICD-10-CM

## 2021-10-22 LAB — CBC WITH DIFFERENTIAL/PLATELET
Basophils Absolute: 0 10*3/uL (ref 0.0–0.1)
Basophils Relative: 0.4 % (ref 0.0–3.0)
Eosinophils Absolute: 0 10*3/uL (ref 0.0–0.7)
Eosinophils Relative: 0.3 % (ref 0.0–5.0)
HCT: 35 % — ABNORMAL LOW (ref 36.0–46.0)
Hemoglobin: 11.4 g/dL — ABNORMAL LOW (ref 12.0–15.0)
Lymphocytes Relative: 13.2 % (ref 12.0–46.0)
Lymphs Abs: 1.1 10*3/uL (ref 0.7–4.0)
MCHC: 32.7 g/dL (ref 30.0–36.0)
MCV: 87.7 fl (ref 78.0–100.0)
Monocytes Absolute: 0.5 10*3/uL (ref 0.1–1.0)
Monocytes Relative: 6.8 % (ref 3.0–12.0)
Neutro Abs: 6.4 10*3/uL (ref 1.4–7.7)
Neutrophils Relative %: 79.3 % — ABNORMAL HIGH (ref 43.0–77.0)
Platelets: 250 10*3/uL (ref 150.0–400.0)
RBC: 3.99 Mil/uL (ref 3.87–5.11)
RDW: 13.5 % (ref 11.5–15.5)
WBC: 8 10*3/uL (ref 4.0–10.5)

## 2021-10-22 LAB — HEPATIC FUNCTION PANEL
ALT: 18 U/L (ref 0–35)
AST: 19 U/L (ref 0–37)
Albumin: 4.5 g/dL (ref 3.5–5.2)
Alkaline Phosphatase: 51 U/L (ref 39–117)
Bilirubin, Direct: 0.1 mg/dL (ref 0.0–0.3)
Total Bilirubin: 0.4 mg/dL (ref 0.2–1.2)
Total Protein: 7.5 g/dL (ref 6.0–8.3)

## 2021-10-22 LAB — BASIC METABOLIC PANEL
BUN: 28 mg/dL — ABNORMAL HIGH (ref 6–23)
CO2: 29 mEq/L (ref 19–32)
Calcium: 9.6 mg/dL (ref 8.4–10.5)
Chloride: 102 mEq/L (ref 96–112)
Creatinine, Ser: 0.97 mg/dL (ref 0.40–1.20)
GFR: 58.07 mL/min — ABNORMAL LOW (ref >60.00)
Glucose, Bld: 97 mg/dL (ref 70–99)
Potassium: 4.1 mEq/L (ref 3.5–5.1)
Sodium: 137 mEq/L (ref 135–145)

## 2021-10-22 LAB — TSH: TSH: 3.04 u[IU]/mL (ref 0.35–5.50)

## 2021-10-22 MED ORDER — AMLODIPINE BESYLATE 5 MG PO TABS
ORAL_TABLET | ORAL | 1 refills | Status: DC
Start: 2021-10-22 — End: 2022-09-24

## 2021-10-22 NOTE — Assessment & Plan Note (Signed)
Chronic problem.  Well controlled on Amlodipine.  Asymptomatic.  No med changes at this time

## 2021-10-22 NOTE — Progress Notes (Signed)
° °  Subjective:    Patient ID: Glenda Rios, female    DOB: 18-Dec-1945, 75 y.o.   MRN: [id number]  HPI HTN- chronic problem, on Amlodipine 5mg  daily w/ good control.  Pt reports feeling good.  No CP, SOB, HAs, visual changes, edema.  Hypothyroid- chronic problem, prescribed Levothyroxine 58mcg daily but not currently taking.  Denies fatigue.  No changes to skin/hair/nails.   Review of Systems For ROS see HPI   This visit occurred during the SARS-CoV-2 public health emergency.  Safety protocols were in place, including screening questions prior to the visit, additional usage of staff PPE, and extensive cleaning of exam room while observing appropriate contact time as indicated for disinfecting solutions.      Objective:   Physical Exam Vitals reviewed.  Constitutional:      General: She is not in acute distress.    Appearance: Normal appearance. She is well-developed. She is not ill-appearing.  HENT:     Head: Normocephalic and atraumatic.  Eyes:     Conjunctiva/sclera: Conjunctivae normal.     Pupils: Pupils are equal, round, and reactive to light.  Neck:     Thyroid: No thyromegaly.  Cardiovascular:     Rate and Rhythm: Normal rate and regular rhythm.     Pulses: Normal pulses.     Heart sounds: Normal heart sounds. No murmur heard. Pulmonary:     Effort: Pulmonary effort is normal. No respiratory distress.     Breath sounds: Normal breath sounds.  Abdominal:     General: There is no distension.     Palpations: Abdomen is soft.     Tenderness: There is no abdominal tenderness.  Musculoskeletal:     Cervical back: Normal range of motion and neck supple.     Right lower leg: No edema.     Left lower leg: No edema.  Lymphadenopathy:     Cervical: No cervical adenopathy.  Skin:    General: Skin is warm and dry.  Neurological:     Mental Status: She is alert and oriented to person, place, and time.  Psychiatric:        Behavior: Behavior normal.           Assessment & Plan:

## 2021-10-22 NOTE — Assessment & Plan Note (Signed)
Currently off medication and asymptomatic.  Would like to remain off meds if possible.  Will check labs to determine next step.

## 2021-10-22 NOTE — Patient Instructions (Signed)
Follow up in 6 months to recheck blood pressure We'll notify you of your lab results and make any changes if needed Keep up the good work on healthy diet and regular exercise- you look great! Call with any questions or concerns Stay Safe!  Stay Healthy! Happy Holidays!!!

## 2022-01-05 ENCOUNTER — Other Ambulatory Visit: Payer: Self-pay | Admitting: Family Medicine

## 2022-04-09 ENCOUNTER — Other Ambulatory Visit: Payer: Self-pay | Admitting: Family Medicine

## 2022-04-15 DIAGNOSIS — K5730 Diverticulosis of large intestine without perforation or abscess without bleeding: Secondary | ICD-10-CM | POA: Diagnosis not present

## 2022-04-15 DIAGNOSIS — Z86010 Personal history of colonic polyps: Secondary | ICD-10-CM | POA: Diagnosis not present

## 2022-04-15 DIAGNOSIS — K648 Other hemorrhoids: Secondary | ICD-10-CM | POA: Diagnosis not present

## 2022-04-15 DIAGNOSIS — D122 Benign neoplasm of ascending colon: Secondary | ICD-10-CM | POA: Diagnosis not present

## 2022-04-15 DIAGNOSIS — K6289 Other specified diseases of anus and rectum: Secondary | ICD-10-CM | POA: Diagnosis not present

## 2022-04-15 DIAGNOSIS — Z09 Encounter for follow-up examination after completed treatment for conditions other than malignant neoplasm: Secondary | ICD-10-CM | POA: Diagnosis not present

## 2022-04-16 LAB — HM COLONOSCOPY

## 2022-04-17 DIAGNOSIS — D122 Benign neoplasm of ascending colon: Secondary | ICD-10-CM | POA: Diagnosis not present

## 2022-04-20 ENCOUNTER — Ambulatory Visit: Payer: Medicare Other | Admitting: Family Medicine

## 2022-04-27 ENCOUNTER — Encounter: Payer: Self-pay | Admitting: Family Medicine

## 2022-04-27 ENCOUNTER — Ambulatory Visit (INDEPENDENT_AMBULATORY_CARE_PROVIDER_SITE_OTHER): Payer: Medicare Other | Admitting: Family Medicine

## 2022-04-27 VITALS — BP 124/68 | HR 75 | Temp 97.6°F | Resp 17 | Ht 61.0 in | Wt 114.5 lb

## 2022-04-27 DIAGNOSIS — E079 Disorder of thyroid, unspecified: Secondary | ICD-10-CM | POA: Diagnosis not present

## 2022-04-27 DIAGNOSIS — E739 Lactose intolerance, unspecified: Secondary | ICD-10-CM

## 2022-04-27 DIAGNOSIS — I10 Essential (primary) hypertension: Secondary | ICD-10-CM

## 2022-04-27 LAB — HEPATIC FUNCTION PANEL
ALT: 19 U/L (ref 0–35)
AST: 21 U/L (ref 0–37)
Albumin: 4.4 g/dL (ref 3.5–5.2)
Alkaline Phosphatase: 42 U/L (ref 39–117)
Bilirubin, Direct: 0.1 mg/dL (ref 0.0–0.3)
Total Bilirubin: 0.4 mg/dL (ref 0.2–1.2)
Total Protein: 7.1 g/dL (ref 6.0–8.3)

## 2022-04-27 LAB — LIPID PANEL
Cholesterol: 166 mg/dL (ref 0–200)
HDL: 80.2 mg/dL (ref >39.00)
LDL Cholesterol: 77 mg/dL (ref 0–99)
NonHDL: 86.28
Total CHOL/HDL Ratio: 2
Triglycerides: 46 mg/dL (ref 0.0–149.0)
VLDL: 9.2 mg/dL (ref 0.0–40.0)

## 2022-04-27 LAB — CBC WITH DIFFERENTIAL/PLATELET
Basophils Absolute: 0 10*3/uL (ref 0.0–0.1)
Basophils Relative: 0.7 % (ref 0.0–3.0)
Eosinophils Absolute: 0.1 10*3/uL (ref 0.0–0.7)
Eosinophils Relative: 1.5 % (ref 0.0–5.0)
HCT: 37.5 % (ref 36.0–46.0)
Hemoglobin: 12.8 g/dL (ref 12.0–15.0)
Lymphocytes Relative: 22.3 % (ref 12.0–46.0)
Lymphs Abs: 1.1 10*3/uL (ref 0.7–4.0)
MCHC: 34.1 g/dL (ref 30.0–36.0)
MCV: 90.9 fl (ref 78.0–100.0)
Monocytes Absolute: 0.4 10*3/uL (ref 0.1–1.0)
Monocytes Relative: 8.3 % (ref 3.0–12.0)
Neutro Abs: 3.4 10*3/uL (ref 1.4–7.7)
Neutrophils Relative %: 67.2 % (ref 43.0–77.0)
Platelets: 205 10*3/uL (ref 150.0–400.0)
RBC: 4.13 Mil/uL (ref 3.87–5.11)
RDW: 14.2 % (ref 11.5–15.5)
WBC: 5.1 10*3/uL (ref 4.0–10.5)

## 2022-04-27 LAB — BASIC METABOLIC PANEL
BUN: 20 mg/dL (ref 6–23)
CO2: 32 mEq/L (ref 19–32)
Calcium: 9.9 mg/dL (ref 8.4–10.5)
Chloride: 103 mEq/L (ref 96–112)
Creatinine, Ser: 0.94 mg/dL (ref 0.40–1.20)
GFR: 60.09 mL/min (ref >60.00)
Glucose, Bld: 101 mg/dL — ABNORMAL HIGH (ref 70–99)
Potassium: 4.3 mEq/L (ref 3.5–5.1)
Sodium: 139 mEq/L (ref 135–145)

## 2022-04-27 LAB — TSH: TSH: 2.64 u[IU]/mL (ref 0.35–5.50)

## 2022-04-27 NOTE — Progress Notes (Signed)
   Subjective:    Patient ID: Glenda Rios, female    DOB: 31-Jul-1946, 76 y.o.   MRN: [id number]  HPI HTN- chronic problem, on Amlodipine '5mg'$  daily.  Denies CP, SOB, HAs, visual changes, edema  Thyroid disease- pt was previously on medication but has been asymptomatic and labs have been WNL recently.  Denies changes to skin/hair/nails.  Energy level has been stable.  Gas/bloating- pt had concerns for lactose intolerance.  She got OTC Lactaid w/ some improvement.  Is now drinking lactose free milk.  Has less gas and bloating than previous.   Review of Systems For ROS see HPI     Objective:   Physical Exam Vitals reviewed.  Constitutional:      General: She is not in acute distress.    Appearance: Normal appearance. She is well-developed. She is not ill-appearing.  HENT:     Head: Normocephalic and atraumatic.  Eyes:     Conjunctiva/sclera: Conjunctivae normal.     Pupils: Pupils are equal, round, and reactive to light.  Neck:     Thyroid: No thyromegaly.  Cardiovascular:     Rate and Rhythm: Normal rate and regular rhythm.     Pulses: Normal pulses.     Heart sounds: Normal heart sounds. No murmur heard. Pulmonary:     Effort: Pulmonary effort is normal. No respiratory distress.     Breath sounds: Normal breath sounds.  Abdominal:     General: There is no distension.     Palpations: Abdomen is soft.     Tenderness: There is no abdominal tenderness.  Musculoskeletal:     Cervical back: Normal range of motion and neck supple.     Right lower leg: No edema.     Left lower leg: No edema.  Lymphadenopathy:     Cervical: No cervical adenopathy.  Skin:    General: Skin is warm and dry.  Neurological:     General: No focal deficit present.     Mental Status: She is alert and oriented to person, place, and time.  Psychiatric:        Mood and Affect: Mood normal.        Behavior: Behavior normal.           Assessment & Plan:

## 2022-04-27 NOTE — Assessment & Plan Note (Signed)
New.  Pt has found that she has increased gas and bloating when she eats dairy products.  Notes her sxs improve when she is able to limit her intake or if she takes Lactaid for her sxs.  Reviewed that there is not an easy test for lactose intolerance and typically we dx w/ an elimination diet like she has already done.  No further work up needed at this time

## 2022-04-27 NOTE — Patient Instructions (Signed)
Schedule your complete physical in 6 months We'll notify you of your lab results and make any changes if needed Continue to try and limit your lactose intake and take Lactaid as needed Keep up the good work!  You look great!! Call with any questions or concerns Stay Safe!  Stay Healthy! Have a great summer!!!

## 2022-04-27 NOTE — Assessment & Plan Note (Signed)
Chronic problem.  Currently well controlled on Amlodipine 5mg daily.  Asymptomatic.  No med changes at this time 

## 2022-04-27 NOTE — Assessment & Plan Note (Signed)
Pt remains off medication at this time and is asymptomatic.  Check labs and determine if medication is needed

## 2022-04-28 NOTE — Progress Notes (Signed)
Pt seen results Via my chart  

## 2022-05-25 ENCOUNTER — Encounter: Payer: Self-pay | Admitting: Family Medicine

## 2022-05-28 ENCOUNTER — Ambulatory Visit (INDEPENDENT_AMBULATORY_CARE_PROVIDER_SITE_OTHER): Payer: Medicare Other

## 2022-05-28 DIAGNOSIS — Z0000 Encounter for general adult medical examination without abnormal findings: Secondary | ICD-10-CM | POA: Diagnosis not present

## 2022-05-28 NOTE — Patient Instructions (Signed)
Glenda Rios , Thank you for taking time to come for your Medicare Wellness Visit. I appreciate your ongoing commitment to your health goals. Please review the following plan we discussed and let me know if I can assist you in the future.   Screening recommendations/referrals: Colonoscopy: 04/16/2022 Mammogram: 10/06/2021 Bone Density: 08/26/2020 Recommended yearly ophthalmology/optometry visit for glaucoma screening and checkup Recommended yearly dental visit for hygiene and checkup  Vaccinations: Influenza vaccine: completed  Pneumococcal vaccine: completed  Tdap vaccine: due  Shingles vaccine: completed     Advanced directives: yes   Conditions/risks identified: none   Next appointment: none    Preventive Care 60 Years and Older, Female Preventive care refers to lifestyle choices and visits with your health care provider that can promote health and wellness. What does preventive care include? A yearly physical exam. This is also called an annual well check. Dental exams once or twice a year. Routine eye exams. Ask your health care provider how often you should have your eyes checked. Personal lifestyle choices, including: Daily care of your teeth and gums. Regular physical activity. Eating a healthy diet. Avoiding tobacco and drug use. Limiting alcohol use. Practicing safe sex. Taking low-dose aspirin every day. Taking vitamin and mineral supplements as recommended by your health care provider. What happens during an annual well check? The services and screenings done by your health care provider during your annual well check will depend on your age, overall health, lifestyle risk factors, and family history of disease. Counseling  Your health care provider may ask you questions about your: Alcohol use. Tobacco use. Drug use. Emotional well-being. Home and relationship well-being. Sexual activity. Eating habits. History of falls. Memory and ability to understand  (cognition). Work and work Statistician. Reproductive health. Screening  You may have the following tests or measurements: Height, weight, and BMI. Blood pressure. Lipid and cholesterol levels. These may be checked every 5 years, or more frequently if you are over 74 years old. Skin check. Lung cancer screening. You may have this screening every year starting at age 745 if you have a 30-pack-year history of smoking and currently smoke or have quit within the past 15 years. Fecal occult blood test (FOBT) of the stool. You may have this test every year starting at age 26. Flexible sigmoidoscopy or colonoscopy. You may have a sigmoidoscopy every 5 years or a colonoscopy every 10 years starting at age 74. Hepatitis C blood test. Hepatitis B blood test. Sexually transmitted disease (STD) testing. Diabetes screening. This is done by checking your blood sugar (glucose) after you have not eaten for a while (fasting). You may have this done every 1-3 years. Bone density scan. This is done to screen for osteoporosis. You may have this done starting at age 74. Mammogram. This may be done every 1-2 years. Talk to your health care provider about how often you should have regular mammograms. Talk with your health care provider about your test results, treatment options, and if necessary, the need for more tests. Vaccines  Your health care provider may recommend certain vaccines, such as: Influenza vaccine. This is recommended every year. Tetanus, diphtheria, and acellular pertussis (Tdap, Td) vaccine. You may need a Td booster every 10 years. Zoster vaccine. You may need this after age 74. Pneumococcal 13-valent conjugate (PCV13) vaccine. One dose is recommended after age 74. Pneumococcal polysaccharide (PPSV23) vaccine. One dose is recommended after age 74. Talk to your health care provider about which screenings and vaccines you need and how often  you need them. This information is not intended to  replace advice given to you by your health care provider. Make sure you discuss any questions you have with your health care provider. Document Released: 11/22/2015 Document Revised: 07/15/2016 Document Reviewed: 08/27/2015 Elsevier Interactive Patient Education  2017 [employer] Prevention in the Home Falls can cause injuries. They can happen to people of all ages. There are many things you can do to make your home safe and to help prevent falls. What can I do on the outside of my home? Regularly fix the edges of walkways and driveways and fix any cracks. Remove anything that might make you trip as you walk through a door, such as a raised step or threshold. Trim any bushes or trees on the path to your home. Use bright outdoor lighting. Clear any walking paths of anything that might make someone trip, such as rocks or tools. Regularly check to see if handrails are loose or broken. Make sure that both sides of any steps have handrails. Any raised decks and porches should have guardrails on the edges. Have any leaves, snow, or ice cleared regularly. Use sand or salt on walking paths during winter. Clean up any spills in your garage right away. This includes oil or grease spills. What can I do in the bathroom? Use night lights. Install grab bars by the toilet and in the tub and shower. Do not use towel bars as grab bars. Use non-skid mats or decals in the tub or shower. If you need to sit down in the shower, use a plastic, non-slip stool. Keep the floor dry. Clean up any water that spills on the floor as soon as it happens. Remove soap buildup in the tub or shower regularly. Attach bath mats securely with double-sided non-slip rug tape. Do not have throw rugs and other things on the floor that can make you trip. What can I do in the bedroom? Use night lights. Make sure that you have a light by your bed that is easy to reach. Do not use any sheets or blankets that are too big for  your bed. They should not hang down onto the floor. Have a firm chair that has side arms. You can use this for support while you get dressed. Do not have throw rugs and other things on the floor that can make you trip. What can I do in the kitchen? Clean up any spills right away. Avoid walking on wet floors. Keep items that you use a lot in easy-to-reach places. If you need to reach something above you, use a strong step stool that has a grab bar. Keep electrical cords out of the way. Do not use floor polish or wax that makes floors slippery. If you must use wax, use non-skid floor wax. Do not have throw rugs and other things on the floor that can make you trip. What can I do with my stairs? Do not leave any items on the stairs. Make sure that there are handrails on both sides of the stairs and use them. Fix handrails that are broken or loose. Make sure that handrails are as long as the stairways. Check any carpeting to make sure that it is firmly attached to the stairs. Fix any carpet that is loose or worn. Avoid having throw rugs at the top or bottom of the stairs. If you do have throw rugs, attach them to the floor with carpet tape. Make sure that you have a light  switch at the top of the stairs and the bottom of the stairs. If you do not have them, ask someone to add them for you. What else can I do to help prevent falls? Wear shoes that: Do not have high heels. Have rubber bottoms. Are comfortable and fit you well. Are closed at the toe. Do not wear sandals. If you use a stepladder: Make sure that it is fully opened. Do not climb a closed stepladder. Make sure that both sides of the stepladder are locked into place. Ask someone to hold it for you, if possible. Clearly mark and make sure that you can see: Any grab bars or handrails. First and last steps. Where the edge of each step is. Use tools that help you move around (mobility aids) if they are needed. These  include: Canes. Walkers. Scooters. Crutches. Turn on the lights when you go into a dark area. Replace any light bulbs as soon as they burn out. Set up your furniture so you have a clear path. Avoid moving your furniture around. If any of your floors are uneven, fix them. If there are any pets around you, be aware of where they are. Review your medicines with your doctor. Some medicines can make you feel dizzy. This can increase your chance of falling. Ask your doctor what other things that you can do to help prevent falls. This information is not intended to replace advice given to you by your health care provider. Make sure you discuss any questions you have with your health care provider. Document Released: 08/22/2009 Document Revised: 04/02/2016 Document Reviewed: 11/30/2014 Elsevier Interactive Patient Education  2017 [employer].

## 2022-05-28 NOTE — Progress Notes (Signed)
Subjective:   Glenda Rios is a 76 y.o. female who presents for Medicare Annual (Subsequent) preventive examination.   I connected with Glenda Rios  today by telephone and verified that I am speaking with the correct person using two identifiers. Location patient: home Location provider: work Persons participating in the virtual visit: patient, provider.   I discussed the limitations, risks, security and privacy concerns of performing an evaluation and management service by telephone and the availability of in person appointments. I also discussed with the patient that there may be a patient responsible charge related to this service. The patient expressed understanding and verbally consented to this telephonic visit.    Interactive audio and video telecommunications were attempted between this provider and patient, however failed, due to patient having technical difficulties OR patient did not have access to video capability.  We continued and completed visit with audio only.    Review of Systems     Cardiac Risk Factors include: advanced age (>52mn, >>89women)     Objective:    Today's Vitals   There is no height or weight on file to calculate BMI.     05/28/2022    1:53 PM 05/26/2021    1:41 PM 05/14/2020    3:12 PM 04/13/2019   10:23 AM  Advanced Directives  Does Patient Have a Medical Advance Directive? Yes Yes Yes Yes  Type of AParamedicof AJeffersonLiving will HHubbellLiving will Living will;Healthcare Power of Attorney Living will;Healthcare Power of ALake Panasoffkeein Chart? No - copy requested No - copy requested No - copy requested No - copy requested    Current Medications (verified) Outpatient Encounter Medications as of 05/28/2022  Medication Sig   amLODipine (NORVASC) 5 MG tablet TAKE 1 TABLET BY MOUTH ONCE DAILY   Cranberry 600 MG TABS Take 1 tablet by mouth daily.    Cyanocobalamin (TH VITAMIN B-12 PO) Take 1 tablet by mouth daily.   Lysine 500 MG CAPS Take 1 capsule by mouth daily.   Multiple Minerals-Vitamins (BONE DENSITY BUILDER PO) Take by mouth.   pantoprazole (PROTONIX) 40 MG tablet TAKE 1 TABLET BY MOUTH ONCE DAILY . APPOINTMENT REQUIRED FOR FUTURE REFILLS   Probiotic Product (PROBIOTIC-10) CAPS Take 1 capsule by mouth daily.   SALINE NASAL SPRAY NA Place into the nose 2 (two) times a day.   No facility-administered encounter medications on file as of 05/28/2022.    Allergies (verified) Iohexol and Codeine   History: Past Medical History:  Diagnosis Date   Allergy    Arthritis    Colon polyps    GERD (gastroesophageal reflux disease)    History of chicken pox    Hypertension    Thyroid disease    Past Surgical History:  Procedure Laterality Date   APPENDECTOMY  1995   BACK SURGERY  2007, 2012   CHOLECYSTECTOMY  1995   Family History  Problem Relation Age of Onset   Breast cancer Maternal Aunt    Hypertension Mother    Stroke Mother    Cancer Mother        Bone Cancer   Diabetes Father    Heart disease Sister    Alcohol abuse Sister    Heart disease Sister    Heart disease Sister    Rheumatic fever Sister    Social History   Socioeconomic History   Marital status: Widowed    Spouse name: FPilar Plate  Number of children: Not on file   Years of education: Not on file   Highest education level: Not on file  Occupational History   Not on file  Tobacco Use   Smoking status: Never   Smokeless tobacco: Never  Vaping Use   Vaping Use: Never used  Substance and Sexual Activity   Alcohol use: Not Currently   Drug use: Not Currently   Sexual activity: Not Currently  Other Topics Concern   Not on file  Social History Narrative   Not on file   Social Determinants of Health   Financial Resource Strain: Low Risk  (05/28/2022)   Overall Financial Resource Strain (CARDIA)    Difficulty of Paying Living Expenses: Not hard at  all  Food Insecurity: No Food Insecurity (05/28/2022)   Hunger Vital Sign    Worried About Running Out of Food in the Last Year: Never true    [place] in the Last Year: Never true  Transportation Needs: No Transportation Needs (05/28/2022)   PRAPARE - Hydrologist (Medical): No    Lack of Transportation (Non-Medical): No  Physical Activity: Insufficiently Active (05/28/2022)   Exercise Vital Sign    Days of Exercise per Week: 3 days    Minutes of Exercise per Session: 30 min  Stress: No Stress Concern Present (05/28/2022)   [place]    Feeling of Stress : Not at all  Social Connections: Moderately Isolated (05/28/2022)   Social Connection and Isolation Panel [NHANES]    Frequency of Communication with Friends and Family: Three times a week    Frequency of Social Gatherings with Friends and Family: Three times a week    Attends Religious Services: More than 4 times per year    Active Member of Clubs or Organizations: No    Attends Archivist Meetings: Never    Marital Status: Widowed    Tobacco Counseling Counseling given: Not Answered   Clinical Intake:  Pre-visit preparation completed: Yes  Pain : No/denies pain     Nutritional Risks: None Diabetes: No  How often do you need to have someone help you when you read instructions, pamphlets, or other written materials from your doctor or pharmacy?: 1 - Never What is the last grade level you completed in school?: [place]   Interpreter Needed?: No  Information entered by :: [place] of Daily Living    05/28/2022    1:57 PM  In your present state of health, do you have any difficulty performing the following activities:  Hearing? 0  Vision? 0  Difficulty concentrating or making decisions? 0  Walking or climbing stairs? 0  Dressing or bathing? 0  Doing errands, shopping? 0   Preparing Food and eating ? N  Using the Toilet? N  In the past six months, have you accidently leaked urine? N  Do you have problems with loss of bowel control? N  Managing your Medications? N  Managing your Finances? N  Housekeeping or managing your Housekeeping? N    Patient Care Team: Midge Minium, MD as PCP - General (Family Medicine) Wonda Horner, MD as Consulting Physician (Gastroenterology) Luberta Mutter, MD as Consulting Physician (Ophthalmology) Gavin Pound, MD as Consulting Physician (Rheumatology) Thurnell Lose, MD as Consulting Physician (Obstetrics and Gynecology) Franchot Gallo, MD as Consulting Physician (Urology) Madelin Rear, [place] as Pharmacist (Pharmacist)  Indicate any recent Medical Services you [phone]  have received from other than [facility] providers in the past year (date may be approximate).     Assessment:   This is a routine wellness examination for Cohen.  Hearing/Vision screen Vision Screening - Comments:: Annual eye exams wear glasses   Dietary issues and exercise activities discussed: Current Exercise Habits: Home exercise routine, Type of exercise: walking;strength training/weights, Time (Minutes): 30, Frequency (Times/Week): 3, Weekly Exercise (Minutes/Week): 90, Intensity: Mild, Exercise limited by: None identified   Goals Addressed   None    Depression Screen    05/28/2022    1:54 PM 05/28/2022    1:52 PM 04/27/2022   10:53 AM 10/22/2021   12:03 PM 05/26/2021    1:44 PM 05/26/2021    1:41 PM 04/15/2021   12:35 PM  PHQ 2/9 Scores  PHQ - 2 Score 0 0 0 0 0 0 0  PHQ- 9 Score   0 0   0    Fall Risk    05/28/2022    1:54 PM 04/27/2022   10:54 AM 10/22/2021   12:04 PM 05/26/2021    1:35 PM 04/15/2021   12:35 PM  [place] in the past year? 0 0 0 0 0  Number falls in past yr: 0 0  0 0  Injury with Fall? 0 0  0 0  Risk for fall due to : No Fall Risks No Fall Risks No Fall Risks  No Fall Risks  Follow up Falls  evaluation completed;Education provided Falls evaluation completed Falls evaluation completed Falls evaluation completed;Falls prevention discussed     FALL RISK PREVENTION PERTAINING TO THE HOME:  Any stairs in or around the home? No  If so, are there any without handrails? No  Home free of loose throw rugs in walkways, pet beds, electrical cords, etc? Yes  Adequate lighting in your home to reduce risk of falls? Yes   ASSISTIVE DEVICES UTILIZED TO PREVENT FALLS:  Life alert? Yes  Use of a cane, walker or w/c? No  Grab bars in the bathroom? Yes  Shower chair or bench in shower? No  Elevated toilet seat or a handicapped toilet? No   Cognitive Function:Normal cognitive status assessed by telephone conversation  by this Nurse Health Advisor. No abnormalities found.      04/13/2019   10:26 AM  MMSE - Mini Mental State Exam  Not completed: Unable to complete        Immunizations Immunization History  Administered Date(s) Administered   Fluad Quad(high Dose 65+) 08/24/2019, 10/22/2021   H1N1 08/30/2017   Influenza, High Dose Seasonal PF 08/31/2007   Influenza,inj,Quad PF,6+ Mos 07/28/2018   Moderna SARS-COV2 Booster Vaccination 09/26/2020   Moderna Sars-Covid-2 Vaccination 12/14/2019, 01/12/2020   Pneumococcal Conjugate-13 01/15/2015   Pneumococcal Polysaccharide-23 02/17/2017   Tdap 08/31/2007    TDAP status: Up to date  Flu Vaccine status: Up to date  Pneumococcal vaccine status: Up to date  Covid-19 vaccine status: Completed vaccines  Qualifies for Shingles Vaccine? Yes   Zostavax completed No   Shingrix Completed?: No.    Education has been provided regarding the importance of this vaccine. Patient has been advised to call insurance company to determine out of pocket expense if they have not yet received this vaccine. Advised may also receive vaccine at local pharmacy or Health Dept. Verbalized acceptance and understanding.  Screening Tests Health Maintenance   Topic Date Due   Zoster Vaccines- Shingrix (1 of 2) 07/28/2022 (Originally 07/20/1965)   TETANUS/TDAP  04/28/2023 (Originally  08/30/2017)   INFLUENZA VACCINE  06/09/2022   MAMMOGRAM  10/01/2022   COLONOSCOPY (Pts 45-74yr Insurance coverage will need to be confirmed)  04/17/2027   Pneumonia Vaccine 74 Years old  Completed   DEXA SCAN  Completed   HPV VACCINES  Aged Out   COVID-19 Vaccine  Discontinued   Hepatitis C Screening  Discontinued    Health Maintenance  There are no preventive care reminders to display for this patient.  Colorectal cancer screening: Type of screening: Colonoscopy. Completed 04/16/2022. Repeat every 5 years  Mammogram status: Completed 10/01/2021. Repeat every year  Bone Density status: Completed 08/26/2020. Results reflect: Bone density results: OSTEOPOROSIS. Repeat every 2 years.  Lung Cancer Screening: (Low Dose CT Chest recommended if Age 633-80years, 30 pack-year currently smoking OR have quit w/in 15years.) does not qualify.   Lung Cancer Screening Referral: n/a  Additional Screening:  Hepatitis C Screening: does not qualify;   Vision Screening: Recommended annual ophthalmology exams for early detection of glaucoma and other disorders of the eye. Is the patient up to date with their annual eye exam?  Yes  Who is the provider or what is the name of the office in which the patient attends annual eye exams? Dr.McQuen  If pt is not established with a provider, would they like to be referred to a provider to establish care? No .   Dental Screening: Recommended annual dental exams for proper oral hygiene  Community Resource Referral / Chronic Care Management: CRR required this visit?  No   CCM required this visit?  No      Plan:     I have personally reviewed and noted the following in the patient's chart:   Medical and social history Use of alcohol, tobacco or illicit drugs  Current medications and supplements including opioid  prescriptions.  Functional ability and status Nutritional status Physical activity Advanced directives List of other physicians Hospitalizations, surgeries, and ER visits in previous 12 months Vitals Screenings to include cognitive, depression, and falls Referrals and appointments  In addition, I have reviewed and discussed with patient certain preventive protocols, quality metrics, and best practice recommendations. A written personalized care plan for preventive services as well as general preventive health recommendations were provided to patient.     LRandel Pigg LPN   [id number]  Nurse Notes: none

## 2022-06-29 DIAGNOSIS — N3000 Acute cystitis without hematuria: Secondary | ICD-10-CM | POA: Diagnosis not present

## 2022-06-29 DIAGNOSIS — R350 Frequency of micturition: Secondary | ICD-10-CM | POA: Diagnosis not present

## 2022-07-02 ENCOUNTER — Other Ambulatory Visit: Payer: Self-pay | Admitting: Lab

## 2022-07-02 ENCOUNTER — Other Ambulatory Visit: Payer: Self-pay | Admitting: Family Medicine

## 2022-07-02 MED ORDER — PANTOPRAZOLE SODIUM 40 MG PO TBEC: DELAYED_RELEASE_TABLET | ORAL | 0 refills | Status: DC

## 2022-09-09 ENCOUNTER — Ambulatory Visit: Payer: Medicare Other | Admitting: Pharmacist

## 2022-09-09 DIAGNOSIS — I10 Essential (primary) hypertension: Secondary | ICD-10-CM

## 2022-09-09 DIAGNOSIS — M810 Age-related osteoporosis without current pathological fracture: Secondary | ICD-10-CM
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
## 2022-09-10 NOTE — Patient Instructions (Addendum)
Visit Information   Goals Addressed             This Visit's Progress    Prevent Falls and Broken Bones-Osteoporosis       Timeframe:  Long-Range Goal Priority:  High Start Date:       09/10/22                      Expected End Date: 03/11/23                      Follow Up Date 12/11/22       Why is this important?   When you fall, there are 3 things that control if a bone breaks or not.  These are the fall itself, how hard and the direction that you fall and how fragile your bones are.  Preventing falls is very important for you because of fragile bones.     Notes:        Patient Care Plan: ccm pharmacy care plan     Problem Identified: HTN Osteoporosis      Long-Range Goal: disease management   Start Date: 09/03/2021  Expected End Date: 09/03/2022  Recent Progress: On track  Priority: High  Note:   Current Barriers:  Unable to achieve control of Osteoporosis   Pharmacist Clinical Goal(s):  Patient will verbalize ability to afford treatment regimen achieve adherence to monitoring guidelines and medication adherence to achieve therapeutic efficacy through collaboration with PharmD and provider.   Interventions: 1:1 collaboration with Midge Minium, MD regarding development and update of comprehensive plan of care as evidenced by provider attestation and co-signature Inter-disciplinary care team collaboration (see longitudinal plan of care) Comprehensive medication review performed; medication list updated in electronic medical record  Hypertension (BP goal <130/80) 09/09/22 -Controlled -Current treatment: Amlodipine 5 mg once daily Appropriate, Effective, Safe, Accessible  -Medications previously tried: n/a  -Current home readings: has not checked in about a week.  "120s/60s normally" -Current dietary habits: portion control, minimal processed foods.  -Current exercise habits: exercise bike, walking.  -Denies hypotensive/hypertensive  symptoms -Educated on BP goals and benefits of medications for prevention of heart attack, stroke and kidney damage; -She loaned her BP cuff to her daughter, plans to order a new one. Continue to check at home - continue active lifestyle! No changes  Osteoporosis (Goal prevent practures) 09/09/22 -Controlled -Did not have good experience with reflux and bisphosphonates  -Last DEXA Scan: 08/26/2020 - osteoporosis  -Patient is a candidate for pharmacologic treatment due to T-Score < -2.5 in lumbar spine -Current treatment  Vitamin D3-Calcium / Bone builder supplement Appropriate, Query effective,  -Medications previously tried: prolia (bone pain), oral bisphosphonates (GERD)  -Recommend [phone] units of vitamin D daily. Recommend 1200 mg of calcium daily from dietary and supplemental sources. Recommend weight-bearing and muscle strengthening exercises for building and maintaining bone density.  Patient is due for a follow up DEXA screening - has upcoming physical next month will coordinate with PCP. She has not has good success with Prolia and Fosamax prior.  She has been taking a bone supplement in hopes it will help. Counseled on fall prevention, recommend repeat DEXA - if remains in osteoporosis would recommend treatment. Consider Reclast or retrial with Prolia.  Hypothyroidism (goal maintain TSH) -Has had trial off of levothyroxine due to weight loss -Current therapy: No current medications -Previous tx: levothyroxine 50 mcg, 75 mcg  -denies any changes or symptoms after stopping the levothyroxine  -No  rx changes  Patient Goals/Self-Care Activities Patient will:  - take medications as prescribed  Medication Assistance: None at this time.   Patient's preferred pharmacy is:  [facility] [address], [place] [address] [place] [address] Phone: [phone] Fax: [phone]   Pt endorses 100% compliance  Follow Up:  Patient agrees to Care  Plan and Follow-up. Plan:CMA Call 2 months to see if she did repeat DEXA. Pharmacist f/u 12 months.          The patient verbalized understanding of instructions, educational materials, and care plan provided today and DECLINED offer to receive copy of patient instructions, educational materials, and care plan.  Telephone follow up appointment with pharmacy team member scheduled for: 6 months  Edythe Clarity, [place], PharmD Clinical Pharmacist  [facility] [phone]

## 2022-09-24 ENCOUNTER — Other Ambulatory Visit: Payer: Self-pay | Admitting: Family Medicine

## 2022-09-28 ENCOUNTER — Other Ambulatory Visit: Payer: Self-pay | Admitting: Family Medicine

## 2022-09-28 DIAGNOSIS — Z1231 Encounter for screening mammogram for malignant neoplasm of breast: Secondary | ICD-10-CM

## 2022-09-28 DIAGNOSIS — H25043 Posterior subcapsular polar age-related cataract, bilateral: Secondary | ICD-10-CM | POA: Diagnosis not present

## 2022-09-28 DIAGNOSIS — H5203 Hypermetropia, bilateral: Secondary | ICD-10-CM | POA: Diagnosis not present

## 2022-09-28 DIAGNOSIS — H35373 Puckering of macula, bilateral: Secondary | ICD-10-CM | POA: Diagnosis not present

## 2022-09-30 ENCOUNTER — Other Ambulatory Visit: Payer: Self-pay | Admitting: Family Medicine

## 2022-10-28 ENCOUNTER — Ambulatory Visit (INDEPENDENT_AMBULATORY_CARE_PROVIDER_SITE_OTHER): Payer: Medicare Other | Admitting: Family Medicine

## 2022-10-28 ENCOUNTER — Encounter: Payer: Self-pay | Admitting: Family Medicine

## 2022-10-28 VITALS — BP 134/60 | HR 92 | Temp 99.1°F | Resp 18 | Ht 61.0 in | Wt 124.0 lb

## 2022-10-28 DIAGNOSIS — Z0000 Encounter for general adult medical examination without abnormal findings: Secondary | ICD-10-CM | POA: Diagnosis not present

## 2022-10-28 DIAGNOSIS — I10 Essential (primary) hypertension: Secondary | ICD-10-CM | POA: Diagnosis not present

## 2022-10-28 DIAGNOSIS — M810 Age-related osteoporosis without current pathological fracture: Secondary | ICD-10-CM | POA: Diagnosis not present

## 2022-10-28 DIAGNOSIS — Z23 Encounter for immunization: Secondary | ICD-10-CM

## 2022-10-28 NOTE — Progress Notes (Signed)
   Subjective:    Patient ID: Glenda Rios, female    DOB: [birth year], 76 y.o.   MRN: [id number]  HPI CPE- mammogram is scheduled, UTD on colonoscopy, Tdap, PNA.  Will get flu shot today.  Pt reports feeling good  Patient Care Team    Relationship Specialty Notifications Start End  Midge Minium, MD PCP - General Family Medicine  08/23/18   Wonda Horner, MD Consulting Physician Gastroenterology  08/23/18   Luberta Mutter, MD Consulting Physician Ophthalmology  08/23/18   Gavin Pound, MD Consulting Physician Rheumatology  08/23/18   Thurnell Lose, MD Consulting Physician Obstetrics and Gynecology  08/23/18   Franchot Gallo, MD Consulting Physician Urology  08/23/18   Edythe Clarity, [facility]  Pharmacist  09/10/22    Comment: [phone]     Health Maintenance  Topic Date Due   INFLUENZA VACCINE  06/09/2022   MAMMOGRAM  10/01/2022   Medicare Annual Wellness (AWV)  05/29/2023   COLONOSCOPY (Pts 45-41yr Insurance coverage will need to be confirmed)  04/17/2027   Pneumonia Vaccine 76 Years old  Completed   DEXA SCAN  Completed   HPV VACCINES  Aged Out   DTaP/Tdap/Td  Discontinued   COVID-19 Vaccine  Discontinued   Hepatitis C Screening  Discontinued   Zoster Vaccines- Shingrix  Discontinued      Review of Systems Patient reports no vision/ hearing changes, adenopathy,fever, weight change,  persistant/recurrent hoarseness , swallowing issues, chest pain, palpitations, edema, persistant/recurrent cough, hemoptysis, dyspnea (rest/exertional/paroxysmal nocturnal), gastrointestinal bleeding (melena, rectal bleeding), abdominal pain, significant heartburn, bowel changes, GU symptoms (dysuria, hematuria, incontinence), Gyn symptoms (abnormal  bleeding, pain),  syncope, focal weakness, memory loss, numbness & tingling, skin/hair/nail changes, abnormal bruising or bleeding, anxiety, or depression.     Objective:   Physical Exam General Appearance:    Alert,  cooperative, no distress, appears stated age  Head:    Normocephalic, without obvious abnormality, atraumatic  Eyes:    PERRL, conjunctiva/corneas clear, EOM's intact both eyes  Ears:    Normal TM's and external ear canals, both ears  Nose:   Nares normal, septum midline, mucosa normal, no drainage    or sinus tenderness  Throat:   Lips, mucosa, and tongue normal; teeth and gums normal  Neck:   Supple, symmetrical, trachea midline, no adenopathy;    Thyroid: no enlargement/tenderness/nodules  Back:     Symmetric, no curvature, ROM normal, no CVA tenderness  Lungs:     Clear to auscultation bilaterally, respirations unlabored  Chest Wall:    No tenderness or deformity   Heart:    Regular rate and rhythm, S1 and S2 normal, no murmur, rub   or gallop  Breast Exam:    Deferred  Abdomen:     Soft, non-tender, bowel sounds active all four quadrants,    no masses, no organomegaly  Genitalia:    Deferred  Rectal:    Extremities:   Extremities normal, atraumatic, no cyanosis or edema  Pulses:   2+ and symmetric all extremities  Skin:   Skin color, texture, turgor normal, no rashes or lesions  Lymph nodes:   Cervical, supraclavicular, and axillary nodes normal  Neurologic:   CNII-XII intact, normal strength, sensation and reflexes    throughout          Assessment & Plan:

## 2022-10-28 NOTE — Assessment & Plan Note (Signed)
Chronic problem.  Excellent control.  Asymptomatic.

## 2022-10-28 NOTE — Assessment & Plan Note (Signed)
Check Vit D and replete prn. 

## 2022-10-28 NOTE — Patient Instructions (Signed)
Follow up in 6 months to recheck blood pressure We'll notify you of your lab results and make any changes if needed Keep up the good work!  You look great!!! Call with any questions or concerns Stay Safe!  Stay Healthy!! Happy Holidays!!!

## 2022-10-28 NOTE — Assessment & Plan Note (Signed)
Pt's PE WNL.  UTD on colonoscopy, Tdap.  Mammo is scheduled.  Flu shot given today.  Check labs.  Anticipatory guidance provided.

## 2022-10-29 ENCOUNTER — Other Ambulatory Visit: Payer: Self-pay

## 2022-10-29 ENCOUNTER — Telehealth: Payer: Self-pay

## 2022-10-29 ENCOUNTER — Other Ambulatory Visit (INDEPENDENT_AMBULATORY_CARE_PROVIDER_SITE_OTHER): Payer: Medicare Other

## 2022-10-29 DIAGNOSIS — R7309 Other abnormal glucose: Secondary | ICD-10-CM | POA: Diagnosis not present

## 2022-10-29 LAB — HEPATIC FUNCTION PANEL
ALT: 20 U/L (ref 0–35)
AST: 20 U/L (ref 0–37)
Albumin: 4.4 g/dL (ref 3.5–5.2)
Alkaline Phosphatase: 44 U/L (ref 39–117)
Bilirubin, Direct: 0.1 mg/dL (ref 0.0–0.3)
Total Bilirubin: 0.4 mg/dL (ref 0.2–1.2)
Total Protein: 7.1 g/dL (ref 6.0–8.3)

## 2022-10-29 LAB — LIPID PANEL
Cholesterol: 161 mg/dL (ref 0–200)
HDL: 85.2 mg/dL (ref >39.00)
LDL Cholesterol: 64 mg/dL (ref 0–99)
NonHDL: 75.42
Total CHOL/HDL Ratio: 2
Triglycerides: 56 mg/dL (ref 0.0–149.0)
VLDL: 11.2 mg/dL (ref 0.0–40.0)

## 2022-10-29 LAB — BASIC METABOLIC PANEL
BUN: 26 mg/dL — ABNORMAL HIGH (ref 6–23)
CO2: 29 mEq/L (ref 19–32)
Calcium: 9.4 mg/dL (ref 8.4–10.5)
Chloride: 105 mEq/L (ref 96–112)
Creatinine, Ser: 1 mg/dL (ref 0.40–1.20)
GFR: 55.59 mL/min — ABNORMAL LOW (ref >60.00)
Glucose, Bld: 128 mg/dL — ABNORMAL HIGH (ref 70–99)
Potassium: 4.2 mEq/L (ref 3.5–5.1)
Sodium: 141 mEq/L (ref 135–145)

## 2022-10-29 LAB — CBC WITH DIFFERENTIAL/PLATELET
Basophils Absolute: 0 10*3/uL (ref 0.0–0.1)
Basophils Relative: 0.5 % (ref 0.0–3.0)
Eosinophils Absolute: 0 10*3/uL (ref 0.0–0.7)
Eosinophils Relative: 0.5 % (ref 0.0–5.0)
HCT: 39.4 % (ref 36.0–46.0)
Hemoglobin: 13.5 g/dL (ref 12.0–15.0)
Lymphocytes Relative: 12.6 % (ref 12.0–46.0)
Lymphs Abs: 1.1 10*3/uL (ref 0.7–4.0)
MCHC: 34.2 g/dL (ref 30.0–36.0)
MCV: 93.4 fl (ref 78.0–100.0)
Monocytes Absolute: 0.6 10*3/uL (ref 0.1–1.0)
Monocytes Relative: 7.3 % (ref 3.0–12.0)
Neutro Abs: 6.8 10*3/uL (ref 1.4–7.7)
Neutrophils Relative %: 79.1 % — ABNORMAL HIGH (ref 43.0–77.0)
Platelets: 230 10*3/uL (ref 150.0–400.0)
RBC: 4.22 Mil/uL (ref 3.87–5.11)
RDW: 12.4 % (ref 11.5–15.5)
WBC: 8.6 10*3/uL (ref 4.0–10.5)

## 2022-10-29 LAB — TSH: TSH: 2.11 u[IU]/mL (ref 0.35–5.50)

## 2022-10-29 LAB — VITAMIN D 25 HYDROXY (VIT D DEFICIENCY, FRACTURES): VITD: 58.45 ng/mL (ref 30.00–100.00)

## 2022-10-29 NOTE — Telephone Encounter (Signed)
Informed pt of lab results . A1C add on has been faxed to lab . Pt states she was not fasting at the time of her labs

## 2022-10-29 NOTE — Telephone Encounter (Signed)
-----   Message from Midge Minium, MD sent at 10/29/2022  2:33 PM EST ----- Labs look good!  Glucose is elevated but I don't think you were fasting.  We will add an A1C to assess for possible diabetes but no other changes at this time

## 2022-10-30 ENCOUNTER — Telehealth: Payer: Self-pay

## 2022-10-30 LAB — HEMOGLOBIN A1C: Hgb A1c MFr Bld: 6.1 % (ref 4.6–6.5)

## 2022-10-30 NOTE — Telephone Encounter (Signed)
-----   Message from Midge Minium, MD sent at 10/30/2022  9:27 AM EST ----- No evidence of diabetes- great news!- but you are in the pre-diabetes range.  This will improve w/ regular exercise and healthy diet.  No other changes at this time

## 2022-10-30 NOTE — Telephone Encounter (Signed)
Pt seen results Via my chart  

## 2022-11-26 ENCOUNTER — Ambulatory Visit
Admission: RE | Admit: 2022-11-26 | Discharge: 2022-11-26 | Disposition: A | Discharge status: Home / Self Care | Payer: Medicare Other | Source: Ambulatory Visit | Attending: Family Medicine | Admitting: Family Medicine

## 2022-11-26 DIAGNOSIS — Z1231 Encounter for screening mammogram for malignant neoplasm of breast: Secondary | ICD-10-CM | POA: Diagnosis not present

## 2022-11-30 ENCOUNTER — Other Ambulatory Visit: Payer: Self-pay | Admitting: Family Medicine

## 2022-11-30 DIAGNOSIS — R928 Other abnormal and inconclusive findings on diagnostic imaging of breast: Secondary | ICD-10-CM

## 2022-12-04 ENCOUNTER — Ambulatory Visit
Admission: RE | Admit: 2022-12-04 | Discharge: 2022-12-04 | Disposition: A | Discharge status: Home / Self Care | Payer: Medicare Other | Source: Ambulatory Visit | Attending: Family Medicine | Admitting: Family Medicine

## 2022-12-04 DIAGNOSIS — R928 Other abnormal and inconclusive findings on diagnostic imaging of breast: Secondary | ICD-10-CM | POA: Diagnosis not present

## 2022-12-07 ENCOUNTER — Other Ambulatory Visit: Payer: Self-pay | Admitting: Family Medicine

## 2022-12-07 DIAGNOSIS — N6310 Unspecified lump in the right breast, unspecified quadrant: Secondary | ICD-10-CM

## 2022-12-11 ENCOUNTER — Ambulatory Visit
Admission: RE | Admit: 2022-12-11 | Discharge: 2022-12-11 | Disposition: A | Discharge status: Home / Self Care | Payer: Medicare Other | Source: Ambulatory Visit | Attending: Family Medicine | Admitting: Family Medicine

## 2022-12-11 ENCOUNTER — Other Ambulatory Visit: Payer: Self-pay | Admitting: Family Medicine

## 2022-12-11 DIAGNOSIS — N6001 Solitary cyst of right breast: Secondary | ICD-10-CM | POA: Diagnosis not present

## 2022-12-11 DIAGNOSIS — N6310 Unspecified lump in the right breast, unspecified quadrant: Secondary | ICD-10-CM

## 2022-12-21 ENCOUNTER — Other Ambulatory Visit: Payer: Self-pay | Admitting: Family Medicine

## 2023-01-01 ENCOUNTER — Other Ambulatory Visit: Payer: Self-pay | Admitting: Family Medicine

## 2023-03-17 ENCOUNTER — Other Ambulatory Visit: Payer: Self-pay | Admitting: Family Medicine

## 2023-03-30 ENCOUNTER — Other Ambulatory Visit: Payer: Self-pay | Admitting: Family Medicine

## 2023-04-22 ENCOUNTER — Ambulatory Visit (INDEPENDENT_AMBULATORY_CARE_PROVIDER_SITE_OTHER): Payer: Medicare Other | Admitting: Family Medicine

## 2023-04-22 ENCOUNTER — Encounter: Payer: Self-pay | Admitting: Family Medicine

## 2023-04-22 VITALS — BP 120/60 | HR 80 | Wt 124.6 lb

## 2023-04-22 DIAGNOSIS — E079 Disorder of thyroid, unspecified: Secondary | ICD-10-CM

## 2023-04-22 DIAGNOSIS — I10 Essential (primary) hypertension: Secondary | ICD-10-CM

## 2023-04-22 LAB — CBC WITH DIFFERENTIAL/PLATELET
Basophils Absolute: 0 10*3/uL (ref 0.0–0.1)
Basophils Relative: 0.5 % (ref 0.0–3.0)
Eosinophils Absolute: 0.1 10*3/uL (ref 0.0–0.7)
Eosinophils Relative: 0.7 % (ref 0.0–5.0)
HCT: 38.2 % (ref 36.0–46.0)
Hemoglobin: 12.9 g/dL (ref 12.0–15.0)
Lymphocytes Relative: 14.8 % (ref 12.0–46.0)
Lymphs Abs: 1.2 10*3/uL (ref 0.7–4.0)
MCHC: 33.8 g/dL (ref 30.0–36.0)
MCV: 91.5 fl (ref 78.0–100.0)
Monocytes Absolute: 0.6 10*3/uL (ref 0.1–1.0)
Monocytes Relative: 7.2 % (ref 3.0–12.0)
Neutro Abs: 6.3 10*3/uL (ref 1.4–7.7)
Neutrophils Relative %: 76.8 % (ref 43.0–77.0)
Platelets: 232 10*3/uL (ref 150.0–400.0)
RBC: 4.18 Mil/uL (ref 3.87–5.11)
RDW: 12.2 % (ref 11.5–15.5)
WBC: 8.2 10*3/uL (ref 4.0–10.5)

## 2023-04-22 LAB — HEPATIC FUNCTION PANEL
ALT: 21 U/L (ref 0–35)
AST: 23 U/L (ref 0–37)
Albumin: 4.5 g/dL (ref 3.5–5.2)
Alkaline Phosphatase: 47 U/L (ref 39–117)
Bilirubin, Direct: 0.1 mg/dL (ref 0.0–0.3)
Total Bilirubin: 0.5 mg/dL (ref 0.2–1.2)
Total Protein: 7.7 g/dL (ref 6.0–8.3)

## 2023-04-22 LAB — BASIC METABOLIC PANEL
BUN: 27 mg/dL — ABNORMAL HIGH (ref 6–23)
CO2: 28 mEq/L (ref 19–32)
Calcium: 9.6 mg/dL (ref 8.4–10.5)
Chloride: 102 mEq/L (ref 96–112)
Creatinine, Ser: 0.86 mg/dL (ref 0.40–1.20)
GFR: 66.39 mL/min (ref >60.00)
Glucose, Bld: 99 mg/dL (ref 70–99)
Potassium: 4.2 mEq/L (ref 3.5–5.1)
Sodium: 138 mEq/L (ref 135–145)

## 2023-04-22 LAB — LIPID PANEL
Cholesterol: 164 mg/dL (ref 0–200)
HDL: 80.8 mg/dL (ref >39.00)
LDL Cholesterol: 73 mg/dL (ref 0–99)
NonHDL: 83.23
Total CHOL/HDL Ratio: 2
Triglycerides: 51 mg/dL (ref 0.0–149.0)
VLDL: 10.2 mg/dL (ref 0.0–40.0)

## 2023-04-22 LAB — TSH: TSH: 2.7 u[IU]/mL (ref 0.35–5.50)

## 2023-04-22 MED ORDER — AMLODIPINE BESYLATE 5 MG PO TABS: ORAL_TABLET | ORAL | 1 refills | Status: DC

## 2023-04-22 NOTE — Assessment & Plan Note (Signed)
Chronic problem.  Excellent control on Amlodipine 5mg  daily.  Asymptomatic.  No med changes at this time

## 2023-04-22 NOTE — Assessment & Plan Note (Signed)
Pt has hx of hypothyroidism but has been able to remain off medication and TSH has been in normal range.  Check labs and start meds prn.

## 2023-04-22 NOTE — Progress Notes (Signed)
   Subjective:    Patient ID: Glenda Rios, female    DOB: January 28, 1946, 77 y.o.   MRN: [id number]  HPI HTN- chronic problem, on Amlodipine 5mg  daily w/ good control.  Pt reports feeling good.  No CP, SOB, HA's, visual changes, edema.  Hypothyroid- pt has hx of hypothyroid but has been off meds w/ stable TSH for some time.  Denies changes to skin/hair/nails, energy level is good   Review of Systems For ROS see HPI     Objective:   Physical Exam Vitals reviewed.  Constitutional:      General: She is not in acute distress.    Appearance: Normal appearance. She is well-developed. She is not ill-appearing.  HENT:     Head: Normocephalic and atraumatic.  Eyes:     Conjunctiva/sclera: Conjunctivae normal.     Pupils: Pupils are equal, round, and reactive to light.  Neck:     Thyroid: No thyromegaly.  Cardiovascular:     Rate and Rhythm: Normal rate and regular rhythm.     Pulses: Normal pulses.     Heart sounds: Normal heart sounds. No murmur heard. Pulmonary:     Effort: Pulmonary effort is normal. No respiratory distress.     Breath sounds: Normal breath sounds.  Abdominal:     General: There is no distension.     Palpations: Abdomen is soft.     Tenderness: There is no abdominal tenderness.  Musculoskeletal:     Cervical back: Normal range of motion and neck supple.     Right lower leg: No edema.     Left lower leg: No edema.  Lymphadenopathy:     Cervical: No cervical adenopathy.  Skin:    General: Skin is warm and dry.  Neurological:     General: No focal deficit present.     Mental Status: She is alert and oriented to person, place, and time.  Psychiatric:        Mood and Affect: Mood normal.        Behavior: Behavior normal.        Thought Content: Thought content normal.           Assessment & Plan:

## 2023-04-22 NOTE — Patient Instructions (Signed)
Schedule your complete physical in 6 months We'll notify you of your lab results and make any changes if needed Keep up the good work on healthy diet and regular exercise- you look great!! Call with any questions or concerns Stay Safe!  Stay Healthy! Have a great summer!!! 

## 2023-04-23 ENCOUNTER — Telehealth: Payer: Self-pay

## 2023-04-23 NOTE — Telephone Encounter (Signed)
-----   Message from Sheliah Hatch, MD sent at 04/22/2023  8:12 PM EDT ----- Labs look great!  No changes at this time

## 2023-05-05 ENCOUNTER — Ambulatory Visit (INDEPENDENT_AMBULATORY_CARE_PROVIDER_SITE_OTHER): Payer: Medicare Other | Admitting: *Deleted

## 2023-05-05 DIAGNOSIS — Z780 Asymptomatic menopausal state: Secondary | ICD-10-CM

## 2023-05-05 DIAGNOSIS — Z0000 Encounter for general adult medical examination without abnormal findings: Secondary | ICD-10-CM

## 2023-05-05 NOTE — Patient Instructions (Signed)
Glenda Rios , Thank you for taking time to come for your Medicare Wellness Visit. I appreciate your ongoing commitment to your health goals. Please review the following plan we discussed and let me know if I can assist you in the future.   Screening recommendations/referrals: Colonoscopy: up to date Mammogram: up to date Bone Density: Education provided Recommended yearly ophthalmology/optometry visit for glaucoma screening and checkup Recommended yearly dental visit for hygiene and checkup  Vaccinations: Influenza vaccine: up to date Pneumococcal vaccine: up to date Tdap vaccine: Education provided Shingles vaccine: Education provided    Advanced directives: yes not on file    Preventive Care 65 Years and Older, Female Preventive care refers to lifestyle choices and visits with your health care provider that can promote health and wellness. What does preventive care include? A yearly physical exam. This is also called an annual well check. Dental exams once or twice a year. Routine eye exams. Ask your health care provider how often you should have your eyes checked. Personal lifestyle choices, including: Daily care of your teeth and gums. Regular physical activity. Eating a healthy diet. Avoiding tobacco and drug use. Limiting alcohol use. Practicing safe sex. Taking low-dose aspirin every day. Taking vitamin and mineral supplements as recommended by your health care provider. What happens during an annual well check? The services and screenings done by your health care provider during your annual well check will depend on your age, overall health, lifestyle risk factors, and family history of disease. Counseling  Your health care provider may ask you questions about your: Alcohol use. Tobacco use. Drug use. Emotional well-being. Home and relationship well-being. Sexual activity. Eating habits. History of falls. Memory and ability to understand (cognition). Work and  work Astronomer. Reproductive health. Screening  You may have the following tests or measurements: Height, weight, and BMI. Blood pressure. Lipid and cholesterol levels. These may be checked every 5 years, or more frequently if you are over 86 years old. Skin check. Lung cancer screening. You may have this screening every year starting at age 41 if you have a 30-pack-year history of smoking and currently smoke or have quit within the past 15 years. Fecal occult blood test (FOBT) of the stool. You may have this test every year starting at age 73. Flexible sigmoidoscopy or colonoscopy. You may have a sigmoidoscopy every 5 years or a colonoscopy every 10 years starting at age 51. Hepatitis C blood test. Hepatitis B blood test. Sexually transmitted disease (STD) testing. Diabetes screening. This is done by checking your blood sugar (glucose) after you have not eaten for a while (fasting). You may have this done every 1-3 years. Bone density scan. This is done to screen for osteoporosis. You may have this done starting at age 68. Mammogram. This may be done every 1-2 years. Talk to your health care provider about how often you should have regular mammograms. Talk with your health care provider about your test results, treatment options, and if necessary, the need for more tests. Vaccines  Your health care provider may recommend certain vaccines, such as: Influenza vaccine. This is recommended every year. Tetanus, diphtheria, and acellular pertussis (Tdap, Td) vaccine. You may need a Td booster every 10 years. Zoster vaccine. You may need this after age 90. Pneumococcal 13-valent conjugate (PCV13) vaccine. One dose is recommended after age 23. Pneumococcal polysaccharide (PPSV23) vaccine. One dose is recommended after age 35. Talk to your health care provider about which screenings and vaccines you need and how often  you need them. This information is not intended to replace advice given to you  by your health care provider. Make sure you discuss any questions you have with your health care provider. Document Released: 11/22/2015 Document Revised: 07/15/2016 Document Reviewed: 08/27/2015 Elsevier Interactive Patient Education  2017 [employer].  Fall Prevention in the Home Falls can cause injuries. They can happen to people of all ages. There are many things you can do to make your home safe and to help prevent falls. What can I do on the outside of my home? Regularly fix the edges of walkways and driveways and fix any cracks. Remove anything that might make you trip as you walk through a door, such as a raised step or threshold. Trim any bushes or trees on the path to your home. Use bright outdoor lighting. Clear any walking paths of anything that might make someone trip, such as rocks or tools. Regularly check to see if handrails are loose or broken. Make sure that both sides of any steps have handrails. Any raised decks and porches should have guardrails on the edges. Have any leaves, snow, or ice cleared regularly. Use sand or salt on walking paths during winter. Clean up any spills in your garage right away. This includes oil or grease spills. What can I do in the bathroom? Use night lights. Install grab bars by the toilet and in the tub and shower. Do not use towel bars as grab bars. Use non-skid mats or decals in the tub or shower. If you need to sit down in the shower, use a plastic, non-slip stool. Keep the floor dry. Clean up any water that spills on the floor as soon as it happens. Remove soap buildup in the tub or shower regularly. Attach bath mats securely with double-sided non-slip rug tape. Do not have throw rugs and other things on the floor that can make you trip. What can I do in the bedroom? Use night lights. Make sure that you have a light by your bed that is easy to reach. Do not use any sheets or blankets that are too big for your bed. They should not  hang down onto the floor. Have a firm chair that has side arms. You can use this for support while you get dressed. Do not have throw rugs and other things on the floor that can make you trip. What can I do in the kitchen? Clean up any spills right away. Avoid walking on wet floors. Keep items that you use a lot in easy-to-reach places. If you need to reach something above you, use a strong step stool that has a grab bar. Keep electrical cords out of the way. Do not use floor polish or wax that makes floors slippery. If you must use wax, use non-skid floor wax. Do not have throw rugs and other things on the floor that can make you trip. What can I do with my stairs? Do not leave any items on the stairs. Make sure that there are handrails on both sides of the stairs and use them. Fix handrails that are broken or loose. Make sure that handrails are as long as the stairways. Check any carpeting to make sure that it is firmly attached to the stairs. Fix any carpet that is loose or worn. Avoid having throw rugs at the top or bottom of the stairs. If you do have throw rugs, attach them to the floor with carpet tape. Make sure that you have a light  switch at the top of the stairs and the bottom of the stairs. If you do not have them, ask someone to add them for you. What else can I do to help prevent falls? Wear shoes that: Do not have high heels. Have rubber bottoms. Are comfortable and fit you well. Are closed at the toe. Do not wear sandals. If you use a stepladder: Make sure that it is fully opened. Do not climb a closed stepladder. Make sure that both sides of the stepladder are locked into place. Ask someone to hold it for you, if possible. Clearly mark and make sure that you can see: Any grab bars or handrails. First and last steps. Where the edge of each step is. Use tools that help you move around (mobility aids) if they are needed. These  include: Canes. Walkers. Scooters. Crutches. Turn on the lights when you go into a dark area. Replace any light bulbs as soon as they burn out. Set up your furniture so you have a clear path. Avoid moving your furniture around. If any of your floors are uneven, fix them. If there are any pets around you, be aware of where they are. Review your medicines with your doctor. Some medicines can make you feel dizzy. This can increase your chance of falling. Ask your doctor what other things that you can do to help prevent falls. This information is not intended to replace advice given to you by your health care provider. Make sure you discuss any questions you have with your health care provider. Document Released: 08/22/2009 Document Revised: 04/02/2016 Document Reviewed: 11/30/2014 Elsevier Interactive Patient Education  2017 [employer].

## 2023-05-05 NOTE — Progress Notes (Signed)
Subjective:   Glenda Rios is a 77 y.o. female who presents for Medicare Annual (Subsequent) preventive examination.  Visit Complete: Virtual  I connected with  Glenda Rios on 05/05/23 by a audio enabled telemedicine application and verified that I am speaking with the correct person using two identifiers.  Patient Location: Home  Provider Location: Home Office  I discussed the limitations of evaluation and management by telemedicine. The patient expressed understanding and agreed to proceed.   Review of Systems     Cardiac Risk Factors include: advanced age (>34men, >63 women);hypertension;family history of premature cardiovascular disease     Objective:    Today's Vitals   There is no height or weight on file to calculate BMI.     05/05/2023    2:11 PM 05/28/2022    1:53 PM 05/26/2021    1:41 PM 05/14/2020    3:12 PM 04/13/2019   10:23 AM  Advanced Directives  Does Patient Have a Medical Advance Directive? Yes Yes Yes Yes Yes  Type of Estate agent of [employer] Power of [place];Living will Healthcare Power of [place];Living will Living will;Healthcare Power of Attorney Living will;Healthcare Power of Attorney  Copy of Healthcare Power of Attorney in Chart? No - copy requested No - copy requested No - copy requested No - copy requested No - copy requested    Current Medications (verified) Outpatient Encounter Medications as of 05/05/2023  Medication Sig   amLODipine (NORVASC) 5 MG tablet Take one tablet daily   Cranberry 600 MG TABS Take 1 tablet by mouth daily.   Cyanocobalamin (TH VITAMIN B-12 PO) Take 1 tablet by mouth daily.   Lysine 500 MG CAPS Take 1 capsule by mouth daily.   Multiple Minerals-Vitamins (BONE DENSITY BUILDER PO) Take by mouth.   pantoprazole (PROTONIX) 40 MG tablet TAKE 1 TABLET BY MOUTH ONCE DAILY . APPOINTMENT REQUIRED FOR FUTURE REFILLS   Probiotic Product (PROBIOTIC-10) CAPS Take 1 capsule by mouth daily.    SALINE NASAL SPRAY NA Place into the nose 2 (two) times a day.   No facility-administered encounter medications on file as of 05/05/2023.    Allergies (verified) Iohexol and Codeine   History: Past Medical History:  Diagnosis Date   Allergy    Arthritis    Colon polyps    GERD (gastroesophageal reflux disease)    History of chicken pox    Hypertension    Thyroid disease    Past Surgical History:  Procedure Laterality Date   APPENDECTOMY  1995   BACK SURGERY  2007, 2012   CHOLECYSTECTOMY  1995   Family History  Problem Relation Age of Onset   Breast cancer Maternal Aunt    Hypertension Mother    Stroke Mother    Cancer Mother        Bone Cancer   Diabetes Father    Heart disease Sister    Alcohol abuse Sister    Heart disease Sister    Heart disease Sister    Rheumatic fever Sister    Social History   Socioeconomic History   Marital status: Widowed    Spouse name: Homero Fellers   Number of children: Not on file   Years of education: Not on file   Highest education level: GED or equivalent  Occupational History   Not on file  Tobacco Use   Smoking status: Never   Smokeless tobacco: Never  Vaping Use   Vaping Use: Never used  Substance and Sexual Activity  Alcohol use: Not Currently   Drug use: Not Currently   Sexual activity: Not Currently  Other Topics Concern   Not on file  Social History Narrative   Not on file   Social Determinants of Health   Financial Resource Strain: Low Risk  (05/05/2023)   Overall Financial Resource Strain (CARDIA)    Difficulty of Paying Living Expenses: Not hard at all  Food Insecurity: No Food Insecurity (05/05/2023)   Hunger Vital Sign    Worried About Running Out of Food in the Last Year: Never true    Ran Out of Food in the Last Year: Never true  Transportation Needs: No Transportation Needs (05/05/2023)   PRAPARE - Administrator, Civil Service (Medical): No    Lack of Transportation (Non-Medical): No   Physical Activity: Sufficiently Active (05/05/2023)   Exercise Vital Sign    Days of Exercise per Week: 4 days    Minutes of Exercise per Session: 40 min  Recent Concern: Physical Activity - Insufficiently Active (04/18/2023)   Exercise Vital Sign    Days of Exercise per Week: 3 days    Minutes of Exercise per Session: 20 min  Stress: No Stress Concern Present (05/05/2023)   [employer] of Occupational Health - Occupational Stress Questionnaire    Feeling of Stress : Not at all  Social Connections: Moderately Integrated (05/05/2023)   Social Connection and Isolation Panel [NHANES]    Frequency of Communication with Friends and Family: More than three times a week    Frequency of Social Gatherings with Friends and Family: More than three times a week    Attends Religious Services: More than 4 times per year    Active Member of [employer] or Organizations: Yes    Attends Banker Meetings: More than 4 times per year    Marital Status: Widowed  Recent Concern: Social Connections - Moderately Isolated (04/18/2023)   Social Connection and Isolation Panel [NHANES]    Frequency of Communication with Friends and Family: More than three times a week    Frequency of Social Gatherings with Friends and Family: Once a week    Attends Religious Services: More than 4 times per year    Active Member of [employer] or Organizations: No    Attends Banker Meetings: Never    Marital Status: Widowed    Tobacco Counseling Counseling given: Not Answered   Clinical Intake:  Pre-visit preparation completed: Yes  Pain : No/denies pain     Diabetes: No  How often do you need to have someone help you when you read instructions, pamphlets, or other written materials from your doctor or pharmacy?: 1 - Never  Interpreter Needed?: No  Information entered by :: Remi Haggard LPN   Activities of Daily Living    05/05/2023    2:06 PM 10/28/2022    1:56 PM  In your present state of  health, do you have any difficulty performing the following activities:  Hearing? 0 0  Vision? 0 0  Difficulty concentrating or making decisions? 0 0  Walking or climbing stairs? 0 0  Dressing or bathing? 0 0  Doing errands, shopping? 0 0  Preparing Food and eating ? N   Using the Toilet? N   In the past six months, have you accidently leaked urine? N   Do you have problems with loss of bowel control? N   Managing your Medications? N   Managing your Finances? N     Patient  Care Team: Sheliah Hatch, MD as PCP - General (Family Medicine) Graylin Shiver, MD as Consulting Physician (Gastroenterology) Maris Berger, MD as Consulting Physician (Ophthalmology) Zenovia Jordan, MD as Consulting Physician (Rheumatology) Geryl Rankins, MD as Consulting Physician (Obstetrics and Gynecology) Marcine Matar, MD as Consulting Physician (Urology) Erroll Luna, [facility] (Inactive) (Pharmacist)  Indicate any recent Medical Services you may have received from other than [facility] providers in the past year (date may be approximate).     Assessment:   This is a routine wellness examination for Zamani.  Hearing/Vision screen Hearing Screening - Comments:: No trouble hearing Vision Screening - Comments:: [facility] Up to date  Dietary issues and exercise activities discussed:     Goals Addressed             This Visit's Progress    Patient Stated       Continue current lifestyle       Depression Screen    05/05/2023    2:12 PM 04/22/2023    1:27 PM 10/28/2022    1:55 PM 05/28/2022    1:54 PM 05/28/2022    1:52 PM 04/27/2022   10:53 AM 10/22/2021   12:03 PM  PHQ 2/9 Scores  PHQ - 2 Score 0 0 0 0 0 0 0  PHQ- 9 Score 0 0 0   0 0    Fall Risk    05/05/2023    2:04 PM 04/22/2023    1:27 PM 10/28/2022    1:56 PM 05/28/2022    1:54 PM 04/27/2022   10:54 AM  Fall Risk   Falls in the past year? 0 0 0 0 0  Number falls in past yr: 0 0  0 0  Injury with Fall? 0 0  0 0  Risk  for fall due to :  No Fall Risks No Fall Risks No Fall Risks No Fall Risks  Follow up Falls evaluation completed;Education provided;Falls prevention discussed Falls evaluation completed Falls evaluation completed Falls evaluation completed;Education provided Falls evaluation completed    MEDICARE RISK AT HOME:  Medicare Risk at Home - 05/05/23 1405     Any stairs in or around the home? No    If so, are there any without handrails? No    Home free of loose throw rugs in walkways, pet beds, electrical cords, etc? Yes    Adequate lighting in your home to reduce risk of falls? Yes    Life alert? No    Use of a cane, walker or w/c? No    Grab bars in the bathroom? Yes    Shower chair or bench in shower? Yes    Elevated toilet seat or a handicapped toilet? No             TIMED UP AND GO:  Was the test performed?  No    Cognitive Function:    04/13/2019   10:26 AM  MMSE - Mini Mental State Exam  Not completed: Unable to complete        05/05/2023    2:09 PM  6CIT Screen  What Year? 0 points  What month? 0 points  What time? 0 points  Count back from 20 0 points  Months in reverse 0 points  Repeat phrase 0 points  Total Score 0 points    Immunizations Immunization History  Administered Date(s) Administered   Fluad Quad(high Dose 65+) 08/24/2019, 10/22/2021   H1N1 08/30/2017   Influenza, High Dose Seasonal PF 08/31/2007, 10/28/2022   Influenza,inj,Quad  PF,6+ Mos 07/28/2018   Moderna SARS-COV2 Booster Vaccination 09/26/2020   Moderna Sars-Covid-2 Vaccination 12/14/2019, 01/12/2020   Pneumococcal Conjugate-13 01/15/2015   Pneumococcal Polysaccharide-23 02/17/2017   Tdap 08/31/2007    TDAP status: Due, Education has been provided regarding the importance of this vaccine. Advised may receive this vaccine at local pharmacy or Health Dept. Aware to provide a copy of the vaccination record if obtained from local pharmacy or Health Dept. Verbalized acceptance and  understanding.  Flu Vaccine status: Up to date  Pneumococcal vaccine status: Up to date  Covid-19 vaccine status: Information provided on how to obtain vaccines.   Qualifies for Shingles Vaccine? Yes   Zostavax completed Yes   Shingrix Completed?: No.    Education has been provided regarding the importance of this vaccine. Patient has been advised to call insurance company to determine out of pocket expense if they have not yet received this vaccine. Advised may also receive vaccine at local pharmacy or Health Dept. Verbalized acceptance and understanding.  Screening Tests Health Maintenance  Topic Date Due   INFLUENZA VACCINE  06/10/2023   MAMMOGRAM  11/27/2023   Medicare Annual Wellness (AWV)  05/04/2024   Colonoscopy  04/17/2027   Pneumonia Vaccine 36+ Years old  Completed   DEXA SCAN  Completed   HPV VACCINES  Aged Out   DTaP/Tdap/Td  Discontinued   COVID-19 Vaccine  Discontinued   Hepatitis C Screening  Discontinued   Zoster Vaccines- Shingrix  Discontinued    Health Maintenance  There are no preventive care reminders to display for this patient.   Colorectal cancer screening: Type of screening: Colonoscopy. Completed 2023. Repeat every 5 years  Mammogram status: Completed  . Repeat every year  Bone Density status: Completed 2021. Results reflect: Bone density results: OSTEOPOROSIS. Repeat every 2 years.  Lung Cancer Screening: (Low Dose CT Chest recommended if Age 56-80 years, 20 pack-year currently smoking OR have quit w/in 15years.) does not qualify.   Lung Cancer Screening Referral:   Additional Screening:  Hepatitis C Screening:    never done  Vision Screening: Recommended annual ophthalmology exams for early detection of glaucoma and other disorders of the eye. Is the patient up to date with their annual eye exam?  Yes  Who is the provider or what is the name of the office in which the patient attends annual eye exams? [facility] If pt is not established with a  provider, would they like to be referred to a provider to establish care? No .   Dental Screening: Recommended annual dental exams for proper oral hygiene    Community Resource Referral / Chronic Care Management: CRR required this visit?  No   CCM required this visit?  No     Plan:     I have personally reviewed and noted the following in the patient's chart:   Medical and social history Use of alcohol, tobacco or illicit drugs  Current medications and supplements including opioid prescriptions. Patient is not currently taking opioid prescriptions. Functional ability and status Nutritional status Physical activity Advanced directives List of other physicians Hospitalizations, surgeries, and ER visits in previous 12 months Vitals Screenings to include cognitive, depression, and falls Referrals and appointments  In addition, I have reviewed and discussed with patient certain preventive protocols, quality metrics, and best practice recommendations. A written personalized care plan for preventive services as well as general preventive health recommendations were provided to patient.     Remi Haggard, LPN   1/61/0960   After Visit  Summary: (MyChart) Due to this being a telephonic visit, the after visit summary with patients personalized plan was offered to patient via MyChart   Nurse Notes:

## 2023-05-11 ENCOUNTER — Encounter: Payer: Self-pay | Admitting: Urgent Care

## 2023-05-11 ENCOUNTER — Ambulatory Visit (INDEPENDENT_AMBULATORY_CARE_PROVIDER_SITE_OTHER): Payer: Medicare Other | Admitting: Urgent Care

## 2023-05-11 VITALS — BP 122/60 | HR 72 | Temp 98.9°F | Resp 16 | Ht 61.0 in | Wt 121.2 lb

## 2023-05-11 DIAGNOSIS — M5412 Radiculopathy, cervical region: Secondary | ICD-10-CM | POA: Diagnosis not present

## 2023-05-11 DIAGNOSIS — R197 Diarrhea, unspecified: Secondary | ICD-10-CM | POA: Diagnosis not present

## 2023-05-11 DIAGNOSIS — L821 Other seborrheic keratosis: Secondary | ICD-10-CM

## 2023-05-11 NOTE — Assessment & Plan Note (Signed)
Chronic problem. Pt concerned as it felt in a different location than normal. I did recommend she keep an eye on it, and should any vesicles occur in the next 2-3 days to notify our office. At this time, I see no indication for acute shingles and suspect her radicular sx to be secondary to her chronic spine issues.

## 2023-05-11 NOTE — Patient Instructions (Signed)
Monitor the area of concern. If you develop any redness or blisters in the area of pain over the next few days, notify our office and we will start an antiviral.  Continue bland foods and adequate hydration to resolve your current diarrheal illness.  Your skin lesion noted today is called a seborrheic keratosis and is a benign skin lesion. No indication for further treatment.  Have a great vacation! Notify us if any new concerns or problems arise.

## 2023-05-11 NOTE — Progress Notes (Signed)
Acute Office Visit  Subjective:     Patient ID: Glenda Rios, female    DOB: Nov 09, 1946, 77 y.o.   MRN: [id number]  Chief Complaint  Patient presents with   Rash    Pt states she has a spot on her back and it is burning  Would like to make sure its not shingles     Rash Associated symptoms include diarrhea (resolved). Pertinent negatives include no fever.   Patient presents today for evaluation of two concerns. First, she states that last evening she had pizza for dinner. Typically does not eat this type of food, and was up all night with diarrhea because of it. States only two bouts of soft, but formed stool since breakfast this morning. Did have some nausea upon awakening, but states that resolved itself after eating boiled eggs and toast for breakfast. No vomiting or abdominal pain. No fever or blood in stool. Additionally, pt is concerned about possible shingles. Was trying to get ready this morning and thought she felt a painful bump on her back around her R bra strap. She reports a hx of chronic radicular sx due to neck issues, which resolves usually with Biofreeze. She states she uses biofreeze nightly. She noticed more intense burning and itching to her R back around the T5-6 area and after feeling, thought she noticed a bump. Wanted to have her back evaluated to ensure it is safe to go on vacation this evening. Pt does have a hx of shingles in 2012, was on her face at that time.   Review of Systems  Constitutional:  Negative for chills, fever and malaise/fatigue.  Gastrointestinal:  Positive for diarrhea (resolved).  Musculoskeletal:  Positive for neck pain (chronic).  Skin:  Positive for itching and rash.  Neurological:  Negative for weakness and headaches.        Objective:    BP 122/60   Pulse 72   Temp 98.9 F (37.2 C) (Temporal)   Resp 16   Ht 5\' 1"  (1.549 m)   Wt 121 lb 4 oz (55 kg)   SpO2 99%   BMI 22.91 kg/m    Physical Exam Vitals and nursing note  reviewed.  Constitutional:      General: She is not in acute distress.    Appearance: Normal appearance. She is normal weight. She is not ill-appearing, toxic-appearing or diaphoretic.  HENT:     Head: Normocephalic and atraumatic.  Cardiovascular:     Rate and Rhythm: Normal rate.  Pulmonary:     Effort: Pulmonary effort is normal. No respiratory distress.  Musculoskeletal:     Cervical back: Neck supple. No rigidity or tenderness.  Lymphadenopathy:     Cervical: No cervical adenopathy.  Skin:    General: Skin is warm and dry.     Coloration: Skin is not jaundiced.     Findings: Lesion (pt has a skin colored seborrheic keratosis at the area of concern) present. No bruising, erythema or rash.     Comments: No vesicles, erythema or other concerning skin findings noted  Neurological:     General: No focal deficit present.     Mental Status: She is alert and oriented to person, place, and time.     Sensory: No sensory deficit.     Gait: Gait normal.  Psychiatric:        Mood and Affect: Mood normal.        Behavior: Behavior normal.     No results found for any  visits on 05/11/23.      Assessment & Plan:   Problem List Items Addressed This Visit       Nervous and Auditory   Cervical radiculopathy    Chronic problem. Pt concerned as it felt in a different location than normal. I did recommend she keep an eye on it, and should any vesicles occur in the next 2-3 days to notify our office. At this time, I see no indication for acute shingles and suspect her radicular sx to be secondary to her chronic spine issues.         Musculoskeletal and Integument   Seborrheic keratoses - Primary    SK noted at concerning site for possible shingles. Reassurance provided. No indication for treatment.       Other Visit Diagnoses     Diarrhea, unspecified type       acute and resolving - self limited, likely due to diet last evening.       No orders of the defined types were  placed in this encounter.   No follow-ups on file.  Kasch Borquez L Ihsan Nomura, PA

## 2023-05-11 NOTE — Assessment & Plan Note (Signed)
SK noted at concerning site for possible shingles. Reassurance provided. No indication for treatment.

## 2023-06-26 ENCOUNTER — Other Ambulatory Visit: Payer: Self-pay | Admitting: Family Medicine

## 2023-09-22 ENCOUNTER — Encounter: Payer: Medicare Other | Admitting: Pharmacist

## 2023-09-22 ENCOUNTER — Other Ambulatory Visit: Payer: Self-pay | Admitting: Family Medicine

## 2023-10-05 DIAGNOSIS — H5203 Hypermetropia, bilateral: Secondary | ICD-10-CM | POA: Diagnosis not present

## 2023-10-05 DIAGNOSIS — H25043 Posterior subcapsular polar age-related cataract, bilateral: Secondary | ICD-10-CM | POA: Diagnosis not present

## 2023-10-05 DIAGNOSIS — H35373 Puckering of macula, bilateral: Secondary | ICD-10-CM | POA: Diagnosis not present

## 2023-11-01 ENCOUNTER — Ambulatory Visit: Payer: Medicare Other | Admitting: Family Medicine

## 2023-11-01 ENCOUNTER — Encounter: Payer: Self-pay | Admitting: Family Medicine

## 2023-11-01 VITALS — BP 138/42 | HR 72 | Temp 98.0°F | Ht 61.0 in

## 2023-11-01 DIAGNOSIS — Z1159 Encounter for screening for other viral diseases: Secondary | ICD-10-CM | POA: Diagnosis not present

## 2023-11-01 DIAGNOSIS — N952 Postmenopausal atrophic vaginitis: Secondary | ICD-10-CM | POA: Insufficient documentation

## 2023-11-01 DIAGNOSIS — Z0000 Encounter for general adult medical examination without abnormal findings: Secondary | ICD-10-CM | POA: Diagnosis not present

## 2023-11-01 DIAGNOSIS — Z23 Encounter for immunization: Secondary | ICD-10-CM | POA: Diagnosis not present

## 2023-11-01 DIAGNOSIS — I10 Essential (primary) hypertension: Secondary | ICD-10-CM | POA: Diagnosis not present

## 2023-11-01 DIAGNOSIS — J309 Allergic rhinitis, unspecified: Secondary | ICD-10-CM | POA: Insufficient documentation

## 2023-11-01 DIAGNOSIS — N951 Menopausal and female climacteric states: Secondary | ICD-10-CM | POA: Insufficient documentation

## 2023-11-01 DIAGNOSIS — M899 Disorder of bone, unspecified: Secondary | ICD-10-CM | POA: Insufficient documentation

## 2023-11-01 DIAGNOSIS — Z860100 Personal history of colon polyps, unspecified: Secondary | ICD-10-CM | POA: Insufficient documentation

## 2023-11-01 DIAGNOSIS — E559 Vitamin D deficiency, unspecified: Secondary | ICD-10-CM

## 2023-11-01 LAB — VITAMIN D 25 HYDROXY (VIT D DEFICIENCY, FRACTURES): VITD: 73.01 ng/mL (ref 30.00–100.00)

## 2023-11-01 LAB — BASIC METABOLIC PANEL
BUN: 27 mg/dL — ABNORMAL HIGH (ref 6–23)
CO2: 28 meq/L (ref 19–32)
Calcium: 9.6 mg/dL (ref 8.4–10.5)
Chloride: 103 meq/L (ref 96–112)
Creatinine, Ser: 1.04 mg/dL (ref 0.40–1.20)
GFR: 52.66 mL/min — ABNORMAL LOW (ref >60.00)
Glucose, Bld: 102 mg/dL — ABNORMAL HIGH (ref 70–99)
Potassium: 4.1 meq/L (ref 3.5–5.1)
Sodium: 139 meq/L (ref 135–145)

## 2023-11-01 LAB — CBC WITH DIFFERENTIAL/PLATELET
Basophils Absolute: 0 10*3/uL (ref 0.0–0.1)
Basophils Relative: 0.7 % (ref 0.0–3.0)
Eosinophils Absolute: 0 10*3/uL (ref 0.0–0.7)
Eosinophils Relative: 0.5 % (ref 0.0–5.0)
HCT: 39 % (ref 36.0–46.0)
Hemoglobin: 13 g/dL (ref 12.0–15.0)
Lymphocytes Relative: 17.4 % (ref 12.0–46.0)
Lymphs Abs: 1.3 10*3/uL (ref 0.7–4.0)
MCHC: 33.4 g/dL (ref 30.0–36.0)
MCV: 90 fL (ref 78.0–100.0)
Monocytes Absolute: 0.5 10*3/uL (ref 0.1–1.0)
Monocytes Relative: 7.1 % (ref 3.0–12.0)
Neutro Abs: 5.4 10*3/uL (ref 1.4–7.7)
Neutrophils Relative %: 74.3 % (ref 43.0–77.0)
Platelets: 274 10*3/uL (ref 150.0–400.0)
RBC: 4.34 Mil/uL (ref 3.87–5.11)
RDW: 13 % (ref 11.5–15.5)
WBC: 7.3 10*3/uL (ref 4.0–10.5)

## 2023-11-01 LAB — HEPATIC FUNCTION PANEL
ALT: 21 U/L (ref 0–35)
AST: 22 U/L (ref 0–37)
Albumin: 4.4 g/dL (ref 3.5–5.2)
Alkaline Phosphatase: 46 U/L (ref 39–117)
Bilirubin, Direct: 0.1 mg/dL (ref 0.0–0.3)
Total Bilirubin: 0.4 mg/dL (ref 0.2–1.2)
Total Protein: 7.3 g/dL (ref 6.0–8.3)

## 2023-11-01 LAB — TSH: TSH: 2.33 u[IU]/mL (ref 0.35–5.50)

## 2023-11-01 LAB — LIPID PANEL
Cholesterol: 168 mg/dL (ref 0–200)
HDL: 75.1 mg/dL (ref >39.00)
LDL Cholesterol: 82 mg/dL (ref 0–99)
NonHDL: 92.61
Total CHOL/HDL Ratio: 2
Triglycerides: 54 mg/dL (ref 0.0–149.0)
VLDL: 10.8 mg/dL (ref 0.0–40.0)

## 2023-11-01 NOTE — Assessment & Plan Note (Signed)
Pt's PE WNL w/ exception of diastolic BP.  She is currently asymptomatic.  UTD on mammo, colonoscopy, PNA.  Flu shot given today.  Check labs.  Anticipatory guidance provided.

## 2023-11-01 NOTE — Assessment & Plan Note (Signed)
Pt stopped Amlodipine b/c BP was running low.  Her home readings are 120s-130s/50-60s.  Today her diastolic BP is considerably lower but she admits to not drinking any water today when she typically drinks quite a bit.  Encouraged her to increase water intake.  Will f/u in 1 month to recheck BP.  If still low, will refer to Cards.  In the meantime, she is to check BP at home and if consistently low or she becomes symptomatic, she is to notify us.  Pt expressed understanding and is in agreement w/ plan.

## 2023-11-01 NOTE — Patient Instructions (Addendum)
Follow up in 1 month to recheck blood pressure We'll notify you of your lab results and make any changes if needed INCREASE your water intake Keep up the good work on healthy diet and regular exercise- you look great!! Call with any questions or concerns Stay Safe!  Stay Healthy! Happy Holidays!!!

## 2023-11-01 NOTE — Progress Notes (Signed)
   Subjective:    Patient ID: Glenda Rios, female    DOB: 08/14/1946, 77 y.o.   MRN: [id number]  HPI CPE- UTD on mammo, colonoscopy, PNA.  Pt reports feeling well.  Patient Care Team    Relationship Specialty Notifications Start End  Sheliah Hatch, MD PCP - General Family Medicine  08/23/18   Graylin Shiver, MD Consulting Physician Gastroenterology  08/23/18   Maris Berger, MD Consulting Physician Ophthalmology  08/23/18   Zenovia Jordan, MD Consulting Physician Rheumatology  08/23/18   Geryl Rankins, MD Consulting Physician Obstetrics and Gynecology  08/23/18   Marcine Matar, MD Consulting Physician Urology  08/23/18   Erroll Luna, [facility] (Inactive)  Pharmacist  09/10/22    Comment: [phone]     Health Maintenance  Topic Date Due   Hepatitis C Screening  Never done   Zoster Vaccines- Shingrix (1 of 2) Never done   DTaP/Tdap/Td (2 - Td or Tdap) 08/30/2017   COVID-19 Vaccine (3 - Moderna risk series) 10/24/2020   INFLUENZA VACCINE  06/10/2023   MAMMOGRAM  11/27/2023   Medicare Annual Wellness (AWV)  05/04/2024   Colonoscopy  04/17/2027   Pneumonia Vaccine 55+ Years old  Completed   DEXA SCAN  Completed   HPV VACCINES  Aged Out      Review of Systems Patient reports no vision/ hearing changes, adenopathy,fever, weight change,  persistant/recurrent hoarseness , swallowing issues, chest pain, palpitations, edema, persistant/recurrent cough, hemoptysis, dyspnea (rest/exertional/paroxysmal nocturnal), gastrointestinal bleeding (melena, rectal bleeding), abdominal pain, significant heartburn, bowel changes, GU symptoms (dysuria, hematuria, incontinence), Gyn symptoms (abnormal  bleeding, pain),  syncope, focal weakness, memory loss, numbness & tingling, skin/hair/nail changes, abnormal bruising or bleeding, anxiety, or depression.     Objective:   Physical Exam General Appearance:    Alert, cooperative, no distress, appears stated age  Head:     Normocephalic, without obvious abnormality, atraumatic  Eyes:    PERRL, conjunctiva/corneas clear, EOM's intact both eyes  Ears:    Normal TM's and external ear canals, both ears  Nose:   Nares normal, septum midline, mucosa normal, no drainage    or sinus tenderness  Throat:   Lips, mucosa, and tongue normal; teeth and gums normal  Neck:   Supple, symmetrical, trachea midline, no adenopathy;    Thyroid: no enlargement/tenderness/nodules  Back:     Symmetric, no curvature, ROM normal, no CVA tenderness  Lungs:     Clear to auscultation bilaterally, respirations unlabored  Chest Wall:    No tenderness or deformity   Heart:    Regular rate and rhythm, S1 and S2 normal, no murmur, rub   or gallop  Breast Exam:    Deferred to GYN  Abdomen:     Soft, non-tender, bowel sounds active all four quadrants,    no masses, no organomegaly  Genitalia:    Deferred to GYN  Rectal:    Extremities:   Extremities normal, atraumatic, no cyanosis or edema  Pulses:   2+ and symmetric all extremities  Skin:   Skin color, texture, turgor normal, no rashes or lesions  Lymph nodes:   Cervical, supraclavicular, and axillary nodes normal  Neurologic:   CNII-XII intact, normal strength, sensation and reflexes    throughout          Assessment & Plan:

## 2023-11-01 NOTE — Assessment & Plan Note (Signed)
Check labs and replete prn. 

## 2023-11-02 ENCOUNTER — Telehealth: Payer: Self-pay

## 2023-11-02 LAB — HEPATITIS C ANTIBODY: Hepatitis C Ab: NONREACTIVE

## 2023-11-02 NOTE — Telephone Encounter (Signed)
-----   Message from Neena Rhymes sent at 11/02/2023  7:28 AM EST ----- Labs look good!  You GFR is slightly decreased compared to 6 months ago but that would be directly impacted by how much water you drink.  No cause for concern, keep up your fluid intake like we talked about

## 2023-11-02 NOTE — Telephone Encounter (Signed)
Pt has been notified.

## 2023-11-18 DIAGNOSIS — H25041 Posterior subcapsular polar age-related cataract, right eye: Secondary | ICD-10-CM | POA: Diagnosis not present

## 2023-11-18 DIAGNOSIS — H25811 Combined forms of age-related cataract, right eye: Secondary | ICD-10-CM | POA: Diagnosis not present

## 2023-11-18 DIAGNOSIS — H268 Other specified cataract: Secondary | ICD-10-CM | POA: Diagnosis not present

## 2023-11-18 HISTORY — PX: CATARACT EXTRACTION: SUR2

## 2023-11-29 ENCOUNTER — Ambulatory Visit: Payer: Medicare Other | Admitting: Family Medicine

## 2023-11-29 ENCOUNTER — Encounter: Payer: Self-pay | Admitting: Family Medicine

## 2023-11-29 VITALS — BP 140/70 | HR 89 | Wt 122.5 lb

## 2023-11-29 DIAGNOSIS — I5189 Other ill-defined heart diseases: Secondary | ICD-10-CM

## 2023-11-29 DIAGNOSIS — I10 Essential (primary) hypertension: Secondary | ICD-10-CM

## 2023-11-29 NOTE — Patient Instructions (Signed)
Follow up in June to recheck BP Continue to drink LOTS of water Your home BP readings look great! Call with any questions or concerns- particularly if you develop excessive fatigue, dizziness, weakness, shortness of breath, etc Stay Safe!  Stay Healthy! Happy New Year!!

## 2023-11-29 NOTE — Progress Notes (Signed)
   Subjective:    Patient ID: Glenda Rios, female    DOB: [birth year], 78 y.o.   MRN: [id number]  HPI Diastolic hypotension- pt has been checking her BP at home and SBP ranges from 122-156 and DBP ranges 56-63.  Today 2 CMA's were unable to get a diastolic reading in L arm as it was still audible <71mmHG.  No dizziness, CP, SOB, visual changes, edema.  Pt is able to ride her exercise bike w/o difficulty.  In checking BP in R arm, I got 162/74   Review of Systems For ROS see HPI     Objective:   Physical Exam Vitals reviewed.  Constitutional:      General: She is not in acute distress.    Appearance: Normal appearance. She is well-developed. She is not ill-appearing.  HENT:     Head: Normocephalic and atraumatic.  Eyes:     Conjunctiva/sclera: Conjunctivae normal.     Pupils: Pupils are equal, round, and reactive to light.  Neck:     Thyroid: No thyromegaly.  Cardiovascular:     Rate and Rhythm: Normal rate and regular rhythm.     Pulses: Normal pulses.     Heart sounds: Normal heart sounds. No murmur heard. Pulmonary:     Effort: Pulmonary effort is normal. No respiratory distress.     Breath sounds: Normal breath sounds.  Abdominal:     General: There is no distension.     Palpations: Abdomen is soft.     Tenderness: There is no abdominal tenderness.  Musculoskeletal:     Cervical back: Normal range of motion and neck supple.     Right lower leg: No edema.     Left lower leg: No edema.  Lymphadenopathy:     Cervical: No cervical adenopathy.  Skin:    General: Skin is warm and dry.  Neurological:     General: No focal deficit present.     Mental Status: She is alert and oriented to person, place, and time.  Psychiatric:        Mood and Affect: Mood normal.        Behavior: Behavior normal.        Thought Content: Thought content normal.           Assessment & Plan:

## 2023-11-29 NOTE — Assessment & Plan Note (Signed)
Chronic problem.  Initially we were not able to get a diastolic reading in her L arm but we are able to clearly hear it on the R.  Currently asymptomatic.  Able to exercise without difficulty.  She is not interested in seeing a Cardiologist at this time.  Will continue to follow.  Reviewed red flags that should prompt immediate return.  Pt expressed understanding and is in agreement w/ plan.

## 2023-12-21 ENCOUNTER — Other Ambulatory Visit: Payer: Self-pay | Admitting: Family Medicine

## 2024-01-11 ENCOUNTER — Ambulatory Visit
Admission: RE | Admit: 2024-01-11 | Discharge: 2024-01-11 | Disposition: A | Discharge status: Home / Self Care | Payer: Medicare Other | Source: Ambulatory Visit | Attending: Family Medicine | Admitting: Family Medicine

## 2024-01-11 DIAGNOSIS — M8588 Other specified disorders of bone density and structure, other site: Secondary | ICD-10-CM | POA: Diagnosis not present

## 2024-01-13 ENCOUNTER — Telehealth: Payer: Self-pay

## 2024-01-13 ENCOUNTER — Encounter: Payer: Self-pay | Admitting: Family Medicine

## 2024-01-13 NOTE — Telephone Encounter (Signed)
 Pt has reviewed results via MyChart.

## 2024-01-13 NOTE — Telephone Encounter (Signed)
-----   Message from Neena Rhymes sent at 01/13/2024  7:38 AM EST ----- Your bone density test is stable and shows that you're right at the edge of osteoporosis.  I know that in the past you were not interested in medication and I still think that's reasonable given your results.  Make sure you are taking Calcium and Vit D daily

## 2024-02-28 ENCOUNTER — Other Ambulatory Visit: Payer: Self-pay | Admitting: Family Medicine

## 2024-02-28 DIAGNOSIS — Z1231 Encounter for screening mammogram for malignant neoplasm of breast: Secondary | ICD-10-CM

## 2024-03-15 ENCOUNTER — Ambulatory Visit
Admission: RE | Admit: 2024-03-15 | Discharge: 2024-03-15 | Disposition: A | Discharge status: Home / Self Care | Source: Ambulatory Visit | Attending: Family Medicine | Admitting: Family Medicine

## 2024-03-15 DIAGNOSIS — Z1231 Encounter for screening mammogram for malignant neoplasm of breast: Secondary | ICD-10-CM | POA: Diagnosis not present

## 2024-03-21 ENCOUNTER — Other Ambulatory Visit: Payer: Self-pay | Admitting: Family Medicine

## 2024-04-28 ENCOUNTER — Ambulatory Visit: Payer: Medicare Other | Admitting: Family Medicine

## 2024-04-28 ENCOUNTER — Encounter: Payer: Self-pay | Admitting: Family Medicine

## 2024-04-28 VITALS — BP 128/70 | HR 86 | Temp 98.0°F | Resp 16 | Ht 60.0 in | Wt 124.8 lb

## 2024-04-28 DIAGNOSIS — I10 Essential (primary) hypertension: Secondary | ICD-10-CM

## 2024-04-28 DIAGNOSIS — R350 Frequency of micturition: Secondary | ICD-10-CM | POA: Diagnosis not present

## 2024-04-28 LAB — POCT URINALYSIS DIPSTICK
Bilirubin, UA: NEGATIVE
Blood, UA: NEGATIVE
Glucose, UA: NEGATIVE
Ketones, UA: NEGATIVE
Leukocytes, UA: NEGATIVE
Nitrite, UA: NEGATIVE
Protein, UA: NEGATIVE
Spec Grav, UA: 1.025 (ref 1.010–1.025)
Urobilinogen, UA: NEGATIVE U/dL — AB
pH, UA: 6 (ref 5.0–8.0)

## 2024-04-28 NOTE — Progress Notes (Signed)
   Subjective:    Patient ID: Glenda Rios, female    DOB: 1945/12/19, 78 y.o.   MRN: [id number]  HPI HTN- currently off medication.  She stopped Amlodipine  due to low BP.  BP remains well controlled- currently 128/70.  Pt has been checking BP since December.  DBP- 55-70s, SBP- 110-140s.  Denies CP, SOB, HA's, visual changes, edema  Urinary frequency- pt reports increased frequency w/ some burning.  States she drinks quite a bit of water.  Pt states some burning of her bottom.  No blood in urine.  No changes to color or odor of urine.  Denies suprapubic pain/pressure.   Review of Systems For ROS see HPI     Objective:   Physical Exam Vitals reviewed.  Constitutional:      General: She is not in acute distress.    Appearance: Normal appearance. She is well-developed.  HENT:     Head: Normocephalic and atraumatic.   Eyes:     Conjunctiva/sclera: Conjunctivae normal.     Pupils: Pupils are equal, round, and reactive to light.   Neck:     Thyroid : No thyromegaly.   Cardiovascular:     Rate and Rhythm: Normal rate and regular rhythm.     Heart sounds: Normal heart sounds. No murmur heard. Pulmonary:     Effort: Pulmonary effort is normal. No respiratory distress.     Breath sounds: Normal breath sounds.  Abdominal:     General: There is no distension.     Palpations: Abdomen is soft.     Tenderness: There is no abdominal tenderness. There is no right CVA tenderness, left CVA tenderness or rebound.   Musculoskeletal:     Cervical back: Normal range of motion and neck supple.     Right lower leg: No edema.     Left lower leg: No edema.  Lymphadenopathy:     Cervical: No cervical adenopathy.   Skin:    General: Skin is warm and dry.   Neurological:     General: No focal deficit present.     Mental Status: She is alert and oriented to person, place, and time.   Psychiatric:        Mood and Affect: Mood normal.        Behavior: Behavior normal.            Assessment & Plan:  Urinary frequency- new.  Pt reports she is having frequency but has increased her water intake due to the heat.  No evidence of UTI on UA today.  Also having some burning but she is concerned this is yeast from sweating.  Wants to try OTC products before a prescription.  She will let me know if things don't improve.

## 2024-04-28 NOTE — Patient Instructions (Signed)
 Schedule your complete physical in 6 months We'll notify you of your urine results and make any changes if needed Get OTC Monistat (Miconazole) cream and apply on the areas that itch or burn 1-2x/day If you have internal symptoms, you can use the applicator and insert as directed Keep up the good work on healthy diet and regular exercise- you look great! Call with any questions or concerns Stay Safe!  Stay Healthy! Have a great summer!

## 2024-04-28 NOTE — Assessment & Plan Note (Signed)
 Continues to be well controlled since stopping Amlodipine .  Home BP's look good.  Currently asymptomatic.  No need for meds at this time.

## 2024-06-12 DIAGNOSIS — H25042 Posterior subcapsular polar age-related cataract, left eye: Secondary | ICD-10-CM | POA: Diagnosis not present

## 2024-06-15 ENCOUNTER — Other Ambulatory Visit: Payer: Self-pay | Admitting: Family Medicine

## 2024-07-05 ENCOUNTER — Ambulatory Visit: Payer: Self-pay

## 2024-07-05 NOTE — Telephone Encounter (Signed)
 Pt reports she has appt Tuesday Advised any changes to been seen at Urgent Care or ER

## 2024-07-05 NOTE — Telephone Encounter (Signed)
 FYI Only or Action Required?: FYI only for provider.  Patient was last seen in primary care on 04/28/2024 by Mahlon Comer BRAVO, MD.  Called Nurse Triage reporting Hypertension.  Symptoms began yesterday.  Interventions attempted: Nothing.  Symptoms are: unchanged.  Triage Disposition: See PCP When Office is Open (Within 3 Days)  Patient/caregiver understands and will follow disposition?: Yes     Copied from CRM #[id number]. Topic: Clinical - Red Word Triage >> Jul 05, 2024  2:31 PM Mesmerise C wrote: Red Word that prompted transfer to Nurse Triage: Patient stated her BP was running low and now been running high, staates she has cataract surgery and was prescribed drops but doctor said not the cause for the changes in BP yesterday evening was 173/77 this morning 185/72 on 8/20 141/49 before that it was 124/57 Reason for Disposition  Systolic BP >= 160 OR Diastolic >= 100  Answer Assessment - Initial Assessment Questions 1. BLOOD PRESSURE: What is your blood pressure? Did you take at least two measurements 5 minutes apart?     173/77 yesterday evening,     185/72 this morning 2. ONSET: When did you take your blood pressure?     This morning 3. HOW: How did you take your blood pressure? (e.g., automatic home BP monitor, visiting nurse)     Home cuff 4. HISTORY: Do you have a history of high blood pressure?     no 5. MEDICINES: Are you taking any medicines for blood pressure? Have you missed any doses recently?     Not on medication 6. OTHER SYMPTOMS: Do you have any symptoms? (e.g., blurred vision, chest pain, difficulty breathing, headache, weakness)     A little swimmy headed, but she does get this with her allergies.  Protocols used: Blood Pressure - High-A-AH

## 2024-07-05 NOTE — Telephone Encounter (Signed)
 Pt states that she recently started new eye drops due to having cataract surgery tomorrow and yesterday she noticed her BP was elevated.  Last reading 180/70 HR 88 while on the phone with NT.  Pt denies sob, chest pain or eye changes.  Please advise

## 2024-07-06 DIAGNOSIS — H268 Other specified cataract: Secondary | ICD-10-CM | POA: Diagnosis not present

## 2024-07-06 DIAGNOSIS — H25812 Combined forms of age-related cataract, left eye: Secondary | ICD-10-CM | POA: Diagnosis not present

## 2024-07-06 DIAGNOSIS — H25042 Posterior subcapsular polar age-related cataract, left eye: Secondary | ICD-10-CM | POA: Diagnosis not present

## 2024-07-09 ENCOUNTER — Telehealth: Admitting: Family

## 2024-07-09 DIAGNOSIS — I10 Essential (primary) hypertension: Secondary | ICD-10-CM | POA: Diagnosis not present

## 2024-07-09 MED ORDER — AMLODIPINE BESYLATE 5 MG PO TABS: 5.0000 mg | ORAL_TABLET | Freq: Every day | ORAL | 0 refills | Status: DC

## 2024-07-09 NOTE — Patient Instructions (Signed)
 Hypertension, Adult High blood pressure (hypertension) is when the force of blood pumping through the arteries is too strong. The arteries are the blood vessels that carry blood from the heart throughout the body. Hypertension forces the heart to work harder to pump blood and may cause arteries to become narrow or stiff. Untreated or uncontrolled hypertension can lead to a heart attack, heart failure, a stroke, kidney disease, and other problems. A blood pressure reading consists of a higher number over a lower number. Ideally, your blood pressure should be below 120/80. The first ("top") number is called the systolic pressure. It is a measure of the pressure in your arteries as your heart beats. The second ("bottom") number is called the diastolic pressure. It is a measure of the pressure in your arteries as the heart relaxes. What are the causes? The exact cause of this condition is not known. There are some conditions that result in high blood pressure. What increases the risk? Certain factors may make you more likely to develop high blood pressure. Some of these risk factors are under your control, including: Smoking. Not getting enough exercise or physical activity. Being overweight. Having too much fat, sugar, calories, or salt (sodium) in your diet. Drinking too much alcohol. Other risk factors include: Having a personal history of heart disease, diabetes, high cholesterol, or kidney disease. Stress. Having a family history of high blood pressure and high cholesterol. Having obstructive sleep apnea. Age. The risk increases with age. What are the signs or symptoms? High blood pressure may not cause symptoms. Very high blood pressure (hypertensive crisis) may cause: Headache. Fast or irregular heartbeats (palpitations). Shortness of breath. Nosebleed. Nausea and vomiting. Vision changes. Severe chest pain, dizziness, and seizures. How is this diagnosed? This condition is diagnosed by  measuring your blood pressure while you are seated, with your arm resting on a flat surface, your legs uncrossed, and your feet flat on the floor. The cuff of the blood pressure monitor will be placed directly against the skin of your upper arm at the level of your heart. Blood pressure should be measured at least twice using the same arm. Certain conditions can cause a difference in blood pressure between your right and left arms. If you have a high blood pressure reading during one visit or you have normal blood pressure with other risk factors, you may be asked to: Return on a different day to have your blood pressure checked again. Monitor your blood pressure at home for 1 week or longer. If you are diagnosed with hypertension, you may have other blood or imaging tests to help your health care provider understand your overall risk for other conditions. How is this treated? This condition is treated by making healthy lifestyle changes, such as eating healthy foods, exercising more, and reducing your alcohol intake. You may be referred for counseling on a healthy diet and physical activity. Your health care provider may prescribe medicine if lifestyle changes are not enough to get your blood pressure under control and if: Your systolic blood pressure is above 130. Your diastolic blood pressure is above 80. Your personal target blood pressure may vary depending on your medical conditions, your age, and other factors. Follow these instructions at home: Eating and drinking  Eat a diet that is high in fiber and potassium, and low in sodium, added sugar, and fat. An example of this eating plan is called the DASH diet. DASH stands for Dietary Approaches to Stop Hypertension. To eat this way: Eat  plenty of fresh fruits and vegetables. Try to fill one half of your plate at each meal with fruits and vegetables. Eat whole grains, such as whole-wheat pasta, brown rice, or whole-grain bread. Fill about one  fourth of your plate with whole grains. Eat or drink low-fat dairy products, such as skim milk or low-fat yogurt. Avoid fatty cuts of meat, processed or cured meats, and poultry with skin. Fill about one fourth of your plate with lean proteins, such as fish, chicken without skin, beans, eggs, or tofu. Avoid pre-made and processed foods. These tend to be higher in sodium, added sugar, and fat. Reduce your daily sodium intake. Many people with hypertension should eat less than 1,500 mg of sodium a day. Do not drink alcohol if: Your health care provider tells you not to drink. You are pregnant, may be pregnant, or are planning to become pregnant. If you drink alcohol: Limit how much you have to: 0-1 drink a day for women. 0-2 drinks a day for men. Know how much alcohol is in your drink. In the [place] one drink equals one 12 oz bottle of beer (355 mL), one 5 oz glass of wine (148 mL), or one 1 oz glass of hard liquor (44 mL). Lifestyle  Work with your health care provider to maintain a healthy body weight or to lose weight. Ask what an ideal weight is for you. Get at least 30 minutes of exercise that causes your heart to beat faster (aerobic exercise) most days of the week. Activities may include walking, swimming, or biking. Include exercise to strengthen your muscles (resistance exercise), such as Pilates or lifting weights, as part of your weekly exercise routine. Try to do these types of exercises for 30 minutes at least 3 days a week. Do not use any products that contain nicotine or tobacco. These products include cigarettes, chewing tobacco, and vaping devices, such as e-cigarettes. If you need help quitting, ask your health care provider. Monitor your blood pressure at home as told by your health care provider. Keep all follow-up visits. This is important. Medicines Take over-the-counter and prescription medicines only as told by your health care provider. Follow directions carefully. Blood  pressure medicines must be taken as prescribed. Do not skip doses of blood pressure medicine. Doing this puts you at risk for problems and can make the medicine less effective. Ask your health care provider about side effects or reactions to medicines that you should watch for. Contact a health care provider if you: Think you are having a reaction to a medicine you are taking. Have headaches that keep coming back (recurring). Feel dizzy. Have swelling in your ankles. Have trouble with your vision. Get help right away if you: Develop a severe headache or confusion. Have unusual weakness or numbness. Feel faint. Have severe pain in your chest or abdomen. Vomit repeatedly. Have trouble breathing. These symptoms may be an emergency. Get help right away. Call 911. Do not wait to see if the symptoms will go away. Do not drive yourself to the hospital. Summary Hypertension is when the force of blood pumping through your arteries is too strong. If this condition is not controlled, it may put you at risk for serious complications. Your personal target blood pressure may vary depending on your medical conditions, your age, and other factors. For most people, a normal blood pressure is less than 120/80. Hypertension is treated with lifestyle changes, medicines, or a combination of both. Lifestyle changes include losing weight, eating a healthy,  low-sodium diet, exercising more, and limiting alcohol. This information is not intended to replace advice given to you by your health care provider. Make sure you discuss any questions you have with your health care provider. Document Revised: 09/02/2021 Document Reviewed: 09/02/2021 Elsevier Patient Education  2024 [employer].

## 2024-07-09 NOTE — Progress Notes (Signed)
 Virtual Visit Consent   Glenda Rios, you are scheduled for a virtual visit with a [facility] provider today. Just as with appointments in the office, your consent must be obtained to participate. Your consent will be active for this visit and any virtual visit you may have with one of our providers in the next 365 days. If you have a MyChart account, a copy of this consent can be sent to you electronically.  As this is a virtual visit, video technology does not allow for your provider to perform a traditional examination. This may limit your provider's ability to fully assess your condition. If your provider identifies any concerns that need to be evaluated in person or the need to arrange testing (such as labs, EKG, etc.), we will make arrangements to do so. Although advances in technology are sophisticated, we cannot ensure that it will always work on either your end or our end. If the connection with a video visit is poor, the visit may have to be switched to a telephone visit. With either a video or telephone visit, we are not always able to ensure that we have a secure connection.  By engaging in this virtual visit, you consent to the provision of healthcare and authorize for your insurance to be billed (if applicable) for the services provided during this visit. Depending on your insurance coverage, you may receive a charge related to this service.  I need to obtain your verbal consent now. Are you willing to proceed with your visit today? Glenda Rios has provided verbal consent on 07/09/2024 for a virtual visit (video or telephone). Bari Learn, FNP  Date: 07/09/2024 4:19 PM   Virtual Visit via Video Note   I, Bari Learn, connected with  Glenda Rios  ([id number], 1946/04/12) on 07/09/24 at  4:15 PM EDT by a video-enabled telemedicine application and verified that I am speaking with the correct person using two identifiers.  Location: Patient: Virtual Visit Location  Patient: Home Provider: Virtual Visit Location Provider: Home Office   I discussed the limitations of evaluation and management by telemedicine and the availability of in person appointments. The patient expressed understanding and agreed to proceed.    History of Present Illness: Glenda Rios is a 78 y.o. who identifies as a female who was assigned female at birth, and is being seen today for elevated blood pressure. She reports she has had HTN in the past and was taken norvasc  5 mg.   Reports she had cataract surgery last week and thought it was elevated because of this. Reports her home BP has been 173/77, 185/72, 180/70, and today it was 200/69.   She has a follow up with her PCP on 07/11/24.  HPI: HPI  Problems:  Patient Active Problem List   Diagnosis Date Noted   Allergic rhinitis 11/01/2023   Atrophy of vagina 11/01/2023   History of colon polyps 11/01/2023   Menopausal and female climacteric states 11/01/2023   Vitamin D  deficiency, unspecified 11/01/2023   Physical exam 10/28/2022   Lactose intolerance 04/27/2022   Cervical radiculopathy 07/03/2020   Cervical pain 07/03/2020   Osteoarthritis 08/24/2019   Seborrheic keratoses 08/24/2019   HTN (hypertension) 08/23/2018   GERD (gastroesophageal reflux disease) 08/23/2018   Osteoporosis 08/23/2018   Thyroid  disease 08/23/2018    Allergies:  Allergies  Allergen Reactions   Iohexol Anaphylaxis     Desc: hives,rash,swelling. entered 02/10/2005 bsw    Codeine  Nausea Only   Medications:  Current Outpatient  Medications:    amLODipine  (NORVASC ) 5 MG tablet, Take 1 tablet (5 mg total) by mouth daily., Disp: 90 tablet, Rfl: 0   Cranberry 600 MG TABS, Take 1 tablet by mouth daily., Disp: , Rfl:    Cyanocobalamin (TH VITAMIN B-12 PO), Take 1 tablet by mouth daily., Disp: , Rfl:    Lysine 500 MG CAPS, Take 1 capsule by mouth daily., Disp: , Rfl:    Multiple Minerals-Vitamins (BONE DENSITY BUILDER PO), Take by mouth., Disp: ,  Rfl:    pantoprazole  (PROTONIX ) 40 MG tablet, TAKE 1 TABLET BY MOUTH ONCE DAILY. APPOINTMENT NEEDED FOR FUTURE REFILLS, Disp: 90 tablet, Rfl: 1   Probiotic Product (PROBIOTIC-10) CAPS, Take 1 capsule by mouth daily., Disp: , Rfl:    SALINE NASAL SPRAY NA, Place into the nose 2 (two) times a day., Disp: , Rfl:   Observations/Objective: Patient is well-developed, well-nourished in no acute distress.  Resting comfortably  at home.  Head is normocephalic, atraumatic.  No labored breathing.  Speech is clear and coherent with logical content.  Patient is alert and oriented at baseline.    Assessment and Plan: 1. Primary hypertension (Primary) - amLODipine  (NORVASC ) 5 MG tablet; Take 1 tablet (5 mg total) by mouth daily.  Dispense: 90 tablet; Refill: 0  Will restart Norvasc  5 mg, has been on it in the past -Bring blood pressure log with visit to PCP on Tuesday -Dash diet information given -Exercise encouraged - Stress Management  -Continue to monitor BP at home Follow up if symptoms worsen or do not improve   Follow Up Instructions: I discussed the assessment and treatment plan with the patient. The patient was provided an opportunity to ask questions and all were answered. The patient agreed with the plan and demonstrated an understanding of the instructions.  A copy of instructions were sent to the patient via MyChart unless otherwise noted below.    The patient was advised to call back or seek an in-person evaluation if the symptoms worsen or if the condition fails to improve as anticipated.    Bari Learn, FNP

## 2024-07-11 ENCOUNTER — Ambulatory Visit (INDEPENDENT_AMBULATORY_CARE_PROVIDER_SITE_OTHER): Admitting: Family Medicine

## 2024-07-11 ENCOUNTER — Encounter: Payer: Self-pay | Admitting: Family Medicine

## 2024-07-11 VITALS — BP 152/42 | HR 78 | Temp 98.7°F | Wt 127.2 lb

## 2024-07-11 DIAGNOSIS — I10 Essential (primary) hypertension: Secondary | ICD-10-CM

## 2024-07-11 DIAGNOSIS — M791 Myalgia, unspecified site: Secondary | ICD-10-CM | POA: Insufficient documentation

## 2024-07-11 DIAGNOSIS — M25519 Pain in unspecified shoulder: Secondary | ICD-10-CM | POA: Insufficient documentation

## 2024-07-11 NOTE — Progress Notes (Unsigned)
   Subjective:    Patient ID: Glenda Rios, female    DOB: 03-26-1946, 78 y.o.   MRN: [id number]  HPI HTN- pt reports BP has been elevated since she had cataract surgery on Thursday last week.  Reports she started the compounded eye drops 4x/day on Tuesday prior to surgery.  By end of day, BP was elevated.  One of the medications in the drops is a steroid.  Had eye exam after surgery and IOP was also elevated.  Had Video Visit on Sunday to discuss high BP- got as high as 200/69- and was restarted on Amlodipine  5mg  daily.  BP is down to 152/52 today.  Yesterday BP was 148/67, 140/64.  Is supposed to continue eye drops this week and then expects to drop to 2x/day.   Review of Systems For ROS see HPI     Objective:   Physical Exam Vitals reviewed.  Constitutional:      General: She is not in acute distress.    Appearance: Normal appearance. She is well-developed. She is not ill-appearing.  HENT:     Head: Normocephalic and atraumatic.  Eyes:     Conjunctiva/sclera: Conjunctivae normal.     Pupils: Pupils are equal, round, and reactive to light.  Neck:     Thyroid : No thyromegaly.  Cardiovascular:     Rate and Rhythm: Normal rate and regular rhythm.     Pulses: Normal pulses.     Heart sounds: Normal heart sounds. No murmur heard. Pulmonary:     Effort: Pulmonary effort is normal. No respiratory distress.     Breath sounds: Normal breath sounds.  Abdominal:     General: There is no distension.     Palpations: Abdomen is soft.     Tenderness: There is no abdominal tenderness.  Musculoskeletal:     Cervical back: Normal range of motion and neck supple.     Right lower leg: No edema.     Left lower leg: No edema.  Lymphadenopathy:     Cervical: No cervical adenopathy.  Skin:    General: Skin is warm and dry.  Neurological:     General: No focal deficit present.     Mental Status: She is alert and oriented to person, place, and time.  Psychiatric:        Mood and Affect:  Mood normal.        Behavior: Behavior normal.        Thought Content: Thought content normal.           Assessment & Plan:

## 2024-07-11 NOTE — Patient Instructions (Signed)
 Follow up in 2 weeks to recheck blood pressure CONTINUE the Amlodipine  once daily unless BP is <110 on the top Drink LOTS of water Try and limit your salt intake- this will elevate your blood pressure Call with any questions or concerns Hang in there!

## 2024-07-13 NOTE — Assessment & Plan Note (Signed)
 Deteriorated.  Pt started steroid eye drops prior to cataract surgery and since then, BP has been significantly elevated.  She had a video visit 2 days ago and was restarted on Amlodipine .  Since then, BP has improved but still not at goal.  Plan is to taper the eye drops which will likely improve BP.  Will not make adjustments at this time- continue Amlodipine .  Will follow closely and stop Amlodipine  if BP is consistently <110.  Pt expressed understanding and is in agreement w/ plan.

## 2024-07-25 ENCOUNTER — Ambulatory Visit: Admitting: Family Medicine

## 2024-07-25 ENCOUNTER — Encounter: Payer: Self-pay | Admitting: Family Medicine

## 2024-07-25 VITALS — BP 138/30 | HR 68 | Temp 98.0°F | Ht 60.0 in | Wt 127.4 lb

## 2024-07-25 DIAGNOSIS — Z23 Encounter for immunization: Secondary | ICD-10-CM | POA: Diagnosis not present

## 2024-07-25 DIAGNOSIS — I10 Essential (primary) hypertension: Secondary | ICD-10-CM | POA: Diagnosis not present

## 2024-07-25 NOTE — Assessment & Plan Note (Signed)
 Chronic problem.  BP is much better today as she continues to wean off her steroid eye drops.  Diastolic # has hx of running low- states it's never been below 52 on her home machine.  Currently asymptomatic.  Will continue to follow.

## 2024-07-25 NOTE — Progress Notes (Signed)
   Subjective:    Patient ID: Glenda Rios, female    DOB: 02-29-1946, 78 y.o.   MRN: [id number]  HPI HTN- chronic problem.  BP spiked after starting steroid eye drops for cataract surgery.  Was started on Amlodipine  5mg  daily.  No CP, SOB, HA's, visual changes, edema.   Review of Systems For ROS see HPI     Objective:   Physical Exam Vitals reviewed.  Constitutional:      General: She is not in acute distress.    Appearance: Normal appearance. She is well-developed. She is not ill-appearing.  HENT:     Head: Normocephalic and atraumatic.  Eyes:     Conjunctiva/sclera: Conjunctivae normal.     Pupils: Pupils are equal, round, and reactive to light.  Neck:     Thyroid : No thyromegaly.  Cardiovascular:     Rate and Rhythm: Normal rate and regular rhythm.     Pulses: Normal pulses.     Heart sounds: Normal heart sounds. No murmur heard. Pulmonary:     Effort: Pulmonary effort is normal. No respiratory distress.     Breath sounds: Normal breath sounds.  Abdominal:     General: There is no distension.     Palpations: Abdomen is soft.     Tenderness: There is no abdominal tenderness.  Musculoskeletal:     Cervical back: Normal range of motion and neck supple.     Right lower leg: No edema.     Left lower leg: No edema.  Lymphadenopathy:     Cervical: No cervical adenopathy.  Skin:    General: Skin is warm and dry.  Neurological:     General: No focal deficit present.     Mental Status: She is alert and oriented to person, place, and time.  Psychiatric:        Mood and Affect: Mood normal.        Behavior: Behavior normal.        Thought Content: Thought content normal.           Assessment & Plan:

## 2024-07-25 NOTE — Patient Instructions (Signed)
 Follow up as needed or as scheduled No med changes at this time As you continue to wean off the eye drops, if the BP is <110 you can stop the Amlodipine  Call with any questions or concerns Stay Safe!  Stay Healthy! Happy Fall!!!

## 2024-08-10 ENCOUNTER — Ambulatory Visit: Payer: Self-pay

## 2024-08-10 NOTE — Telephone Encounter (Signed)
 FYI Only or Action Required?: FYI only for provider.  Patient was last seen in primary care on 07/25/2024 by Mahlon Comer BRAVO, MD.  Called Nurse Triage reporting Sinusitis.  Symptoms began several days ago.  Interventions attempted: OTC medications: Saline, Coricidin, and Ibuprofen and Rest, hydration, or home remedies.  Symptoms are: gradually worsening.  Triage Disposition: See PCP When Office is Open (Within 3 Days)  Patient/caregiver understands and will follow disposition?: Yes- seen on 9/16 for flu shot, has tried several home measure with no relief and trouble sleeping. Appt scheduled for 10/3 with Dr. Landy  Copied from CRM [id number]. Topic: Clinical - Red Word Triage >> Aug 10, 2024  9:50 AM Roselie BROCKS wrote: [facility] that prompted transfer to Nurse Triage: Patient states she feels like her whole head is stopped up, so much pressure that she can't sleep, coughing up yellow phlegm and has high blood pressure so can't take much over the counter meds. Reason for Disposition  Lots of coughing  Answer Assessment - Initial Assessment Questions 1. LOCATION: Where does it hurt?      All over head, no pain just feels stopped up  2. ONSET: When did the sinus pain start?  (e.g., hours, days)      Started on Monday, 3 days ago  3. SEVERITY: How bad is the pain?   (Scale 0-10; or none, mild, moderate or severe)     Mild to moderate  4. RECURRENT SYMPTOM: Have you ever had sinus problems before? If Yes, ask: When was the last time? and What happened that time?      Yes, in the past, but it has been a long time. Can usually use saline to flush nose   5. NASAL CONGESTION: Is the nose blocked? If Yes, ask: Can you open it or must you breathe through your mouth?     Intermittent congestion, moreso at night  6. NASAL DISCHARGE: Do you have discharge from your nose? If so ask, What color?     Yes, blowing nose constantly, yellow   7. FEVER: Do you have a fever?  If Yes, ask: What is it, how was it measured, and when did it start?      No  8. OTHER SYMPTOMS: Do you have any other symptoms? (e.g., sore throat, cough, earache, difficulty breathing)     Trouble sleeping, scratchy throat, and productive cough,post nasal drainage  9. PREGNANCY: Is there any chance you are pregnant? When was your last menstrual period?     No  Protocols used: Sinus Pain or Congestion-A-AH

## 2024-08-10 NOTE — Telephone Encounter (Signed)
 Patient is being seen by Dr Levora tomorrow 08/11/2024

## 2024-08-11 ENCOUNTER — Encounter: Payer: Self-pay | Admitting: Family Medicine

## 2024-08-11 ENCOUNTER — Ambulatory Visit: Admitting: Family Medicine

## 2024-08-11 VITALS — BP 142/30 | HR 81 | Temp 99.0°F | Resp 21 | Ht 60.0 in | Wt 126.0 lb

## 2024-08-11 DIAGNOSIS — R0981 Nasal congestion: Secondary | ICD-10-CM

## 2024-08-11 DIAGNOSIS — R051 Acute cough: Secondary | ICD-10-CM

## 2024-08-11 DIAGNOSIS — J3489 Other specified disorders of nose and nasal sinuses: Secondary | ICD-10-CM

## 2024-08-11 DIAGNOSIS — J019 Acute sinusitis, unspecified: Secondary | ICD-10-CM | POA: Diagnosis not present

## 2024-08-11 LAB — POC COVID19 BINAXNOW: SARS Coronavirus 2 Ag: NEGATIVE

## 2024-08-11 MED ORDER — AMOXICILLIN-POT CLAVULANATE 875-125 MG PO TABS: 1.0000 | ORAL_TABLET | Freq: Two times a day (BID) | ORAL | 0 refills | Status: DC

## 2024-08-11 MED ORDER — PROMETHAZINE-DM 6.25-15 MG/5ML PO SYRP: 5.0000 mL | ORAL_SOLUTION | Freq: Every evening | ORAL | 0 refills | Status: AC | PRN

## 2024-08-11 NOTE — Patient Instructions (Signed)
 Thank you for coming in today.  I am sorry that you are sick.  As we discussed this is right on the turning point of whether your symptoms are still due to a virus or early sinus infection.  I will prescribe Augmentin, but watch for some of the side effects we discussed.  Let us  know if there are issues.  Coricidin during the day is fine, along with fluids, rest and saline nasal spray.  I did send in some cough syrup if needed at night to allow some rest.  That medicine can cause significant amount of sedation so be careful taking that medication and watch for unsteadiness or risk for falls.  I expect your symptoms to be improving into next week, please be seen if not improving or sooner if any worsening symptoms but I do not expect that to occur.  Hope you feel better soon!  Sinus Infection, Adult A sinus infection, also called sinusitis, is inflammation of your sinuses. Sinuses are hollow spaces in the bones around your face. Your sinuses are located: Around your eyes. In the middle of your forehead. Behind your nose. In your cheekbones. Mucus normally drains out of your sinuses. When your nasal tissues become inflamed or swollen, mucus can become trapped or blocked. This allows bacteria, viruses, and fungi to grow, which leads to infection. Most infections of the sinuses are caused by a virus. A sinus infection can develop quickly. It can last for up to 4 weeks (acute) or for more than 12 weeks (chronic). A sinus infection often develops after a cold. What are the causes? This condition is caused by anything that creates swelling in the sinuses or stops mucus from draining. This includes: Allergies. Asthma. Infection from bacteria or viruses. Deformities or blockages in your nose or sinuses. Abnormal growths in the nose (nasal polyps). Pollutants, such as chemicals or irritants in the air. Infection from fungi. This is rare. What increases the risk? You are more likely to develop this  condition if you: Have a weak body defense system (immune system). Do a lot of swimming or diving. Overuse nasal sprays. Smoke. What are the signs or symptoms? The main symptoms of this condition are pain and a feeling of pressure around the affected sinuses. Other symptoms include: Stuffy nose or congestion that makes it difficult to breathe through your nose. Thick yellow or greenish drainage from your nose. Tenderness, swelling, and warmth over the affected sinuses. A cough that may get worse at night. Decreased sense of smell and taste. Extra mucus that collects in the throat or the back of the nose (postnasal drip) causing a sore throat or bad breath. Tiredness (fatigue). Fever. How is this diagnosed? This condition is diagnosed based on: Your symptoms. Your medical history. A physical exam. Tests to find out if your condition is acute or chronic. This may include: Checking your nose for nasal polyps. Viewing your sinuses using a device that has a light (endoscope). Testing for allergies or bacteria. Imaging tests, such as an MRI or CT scan. In rare cases, a bone biopsy may be done to rule out more serious types of fungal sinus disease. How is this treated? Treatment for a sinus infection depends on the cause and whether your condition is chronic or acute. If caused by a virus, your symptoms should go away on their own within 10 days. You may be given medicines to relieve symptoms. They include: Medicines that shrink swollen nasal passages (decongestants). A spray that eases inflammation of  the nostrils (topical intranasal corticosteroids). Rinses that help get rid of thick mucus in your nose (nasal saline washes). Medicines that treat allergies (antihistamines). Over-the-counter pain relievers. If caused by bacteria, your health care provider may recommend waiting to see if your symptoms improve. Most bacterial infections will get better without antibiotic medicine. You may be  given antibiotics if you have: A severe infection. A weak immune system. If caused by narrow nasal passages or nasal polyps, surgery may be needed. Follow these instructions at home: Medicines Take, use, or apply over-the-counter and prescription medicines only as told by your health care provider. These may include nasal sprays. If you were prescribed an antibiotic medicine, take it as told by your health care provider. Do not stop taking the antibiotic even if you start to feel better. Hydrate and humidify  Drink enough fluid to keep your urine pale yellow. Staying hydrated will help to thin your mucus. Use a cool mist humidifier to keep the humidity level in your home above 50%. Inhale steam for 10-15 minutes, 3-4 times a day, or as told by your health care provider. You can do this in the bathroom while a hot shower is running. Limit your exposure to cool or dry air. Rest Rest as much as possible. Sleep with your head raised (elevated). Make sure you get enough sleep each night. General instructions  Apply a warm, moist washcloth to your face 3-4 times a day or as told by your health care provider. This will help with discomfort. Use nasal saline washes as often as told by your health care provider. Wash your hands often with soap and water to reduce your exposure to germs. If soap and water are not available, use hand sanitizer. Do not smoke. Avoid being around people who are smoking (secondhand smoke). Keep all follow-up visits. This is important. Contact a health care provider if: You have a fever. Your symptoms get worse. Your symptoms do not improve within 10 days. Get help right away if: You have a severe headache. You have persistent vomiting. You have severe pain or swelling around your face or eyes. You have vision problems. You develop confusion. Your neck is stiff. You have trouble breathing. These symptoms may be an emergency. Get help right away. Call 911. Do  not wait to see if the symptoms will go away. Do not drive yourself to the hospital. Summary A sinus infection is soreness and inflammation of your sinuses. Sinuses are hollow spaces in the bones around your face. This condition is caused by nasal tissues that become inflamed or swollen. The swelling traps or blocks the flow of mucus. This allows bacteria, viruses, and fungi to grow, which leads to infection. If you were prescribed an antibiotic medicine, take it as told by your health care provider. Do not stop taking the antibiotic even if you start to feel better. Keep all follow-up visits. This is important. This information is not intended to replace advice given to you by your health care provider. Make sure you discuss any questions you have with your health care provider. Document Revised: 09/30/2021 Document Reviewed: 09/30/2021 [employer] Patient Education  2024 [employer]  Upper Respiratory Infection, Adult An upper respiratory infection (URI) is a common viral infection of the nose, throat, and upper air passages that lead to the lungs. The most common type of URI is the common cold. URIs usually get better on their own, without medical treatment. What are the causes? A URI is caused by a virus.  You may catch a virus by: Breathing in droplets from an infected person's cough or sneeze. Touching something that has been exposed to the virus (is contaminated) and then touching your mouth, nose, or eyes. What increases the risk? You are more likely to get a URI if: You are very young or very old. You have close contact with others, such as at work, school, or a health care facility. You smoke. You have long-term (chronic) heart or lung disease. You have a weakened disease-fighting system (immune system). You have nasal allergies or asthma. You are experiencing a lot of stress. You have poor nutrition. What are the signs or symptoms? A URI usually involves some of the following  symptoms: Runny or stuffy (congested) nose. Cough. Sneezing. Sore throat. Headache. Fatigue. Fever. Loss of appetite. Pain in your forehead, behind your eyes, and over your cheekbones (sinus pain). Muscle aches. Redness or irritation of the eyes. Pressure in the ears or face. How is this diagnosed? This condition may be diagnosed based on your medical history and symptoms, and a physical exam. Your health care provider may use a swab to take a mucus sample from your nose (nasal swab). This sample can be tested to determine what virus is causing the illness. How is this treated? URIs usually get better on their own within 7-10 days. Medicines cannot cure URIs, but your health care provider may recommend certain medicines to help relieve symptoms, such as: Over-the-counter cold medicines. Cough suppressants. Coughing is a type of defense against infection that helps to clear the respiratory system, so take these medicines only as recommended by your health care provider. Fever-reducing medicines. Follow these instructions at home: Activity Rest as needed. If you have a fever, stay home from work or school until your fever is gone or until your health care provider says your URI cannot spread to other people (is no longer contagious). Your health care provider may have you wear a face mask to prevent your infection from spreading. Relieving symptoms Gargle with a mixture of salt and water 3-4 times a day or as needed. To make salt water, completely dissolve -1 tsp (3-6 g) of salt in 1 cup (237 mL) of warm water. Use a cool-mist humidifier to add moisture to the air. This can help you breathe more easily. Eating and drinking  Drink enough fluid to keep your urine pale yellow. Eat soups and other clear broths. General instructions  Take over-the-counter and prescription medicines only as told by your health care provider. These include cold medicines, fever reducers, and cough  suppressants. Do not use any products that contain nicotine or tobacco. These products include cigarettes, chewing tobacco, and vaping devices, such as e-cigarettes. If you need help quitting, ask your health care provider. Stay away from secondhand smoke. Stay up to date on all immunizations, including the yearly (annual) flu vaccine. Keep all follow-up visits. This is important. How to prevent the spread of infection to others URIs can be contagious. To prevent the infection from spreading: Wash your hands with soap and water for at least 20 seconds. If soap and water are not available, use hand sanitizer. Avoid touching your mouth, face, eyes, or nose. Cough or sneeze into a tissue or your sleeve or elbow instead of into your hand or into the air.  Contact a health care provider if: You are getting worse instead of better. You have a fever or chills. Your mucus is brown or red. You have yellow or brown discharge coming  from your nose. You have pain in your face, especially when you bend forward. You have swollen neck glands. You have pain while swallowing. You have white areas in the back of your throat. Get help right away if: You have shortness of breath that gets worse. You have severe or persistent: Headache. Ear pain. Sinus pain. Chest pain. You have chronic lung disease along with any of the following: Making high-pitched whistling sounds when you breathe, most often when you breathe out (wheezing). Prolonged cough (more than 14 days). Coughing up blood. A change in your usual mucus. You have a stiff neck. You have changes in your: Vision. Hearing. Thinking. Mood. These symptoms may be an emergency. Get help right away. Call 911. Do not wait to see if the symptoms will go away. Do not drive yourself to the hospital. Summary An upper respiratory infection (URI) is a common infection of the nose, throat, and upper air passages that lead to the lungs. A URI is caused  by a virus. URIs usually get better on their own within 7-10 days. Medicines cannot cure URIs, but your health care provider may recommend certain medicines to help relieve symptoms. This information is not intended to replace advice given to you by your health care provider. Make sure you discuss any questions you have with your health care provider. Document Revised: 05/28/2021 Document Reviewed: 05/28/2021 [employer] Patient Education  2024 [employer].

## 2024-08-11 NOTE — Progress Notes (Signed)
 Subjective:  Patient ID: Glenda Rios, female    DOB: August 22, 1946  Age: 78 y.o. MRN: [id number]  CC:  Chief Complaint  Patient presents with   URI    Cough and congestion. Sinus pressure. Yellow mucus. Sx started Monday.    HPI Glenda Rios presents for  Acute visit for above. PCP is Dr. Mahlon.  Cough/congestion Symptoms started 4 days ago. PND, then sinus drainage,  sore throat initially - has worsened. Does have allergies, but this feels worse. No fevers at home. No known sick contacts.  Discolored mucus along with sinus pressure past few days. Initially clear. Increased cough past 2 days. Trouble sleeping past few nights.  No dyspnea, drinking fluids.   Tx: delsym, coricidin - min relief. No relief with tessalon perles in the past. Nausea with codeine  in past.  Codeine  allergy    History Patient Active Problem List   Diagnosis Date Noted   Muscle pain 07/11/2024   Shoulder joint pain 07/11/2024   Allergic rhinitis 11/01/2023   Atrophy of vagina 11/01/2023   History of colon polyps 11/01/2023   Menopausal and female climacteric states 11/01/2023   Vitamin D  deficiency, unspecified 11/01/2023   Physical exam 10/28/2022   Lactose intolerance 04/27/2022   Cervical radiculopathy 07/03/2020   Cervical pain 07/03/2020   Osteoarthritis 08/24/2019   Seborrheic keratoses 08/24/2019   HTN (hypertension) 08/23/2018   GERD (gastroesophageal reflux disease) 08/23/2018   Osteoporosis 08/23/2018   Thyroid  disease 08/23/2018   Past Medical History:  Diagnosis Date   Allergy    Arthritis    Cataract    Colon polyps    GERD (gastroesophageal reflux disease)    History of chicken pox    Hypertension    Osteoporosis    Thyroid  disease    Past Surgical History:  Procedure Laterality Date   APPENDECTOMY  1995   BACK SURGERY  2007, 2012   CATARACT EXTRACTION Right 11/18/2023   CHOLECYSTECTOMY  1995   Allergies  Allergen Reactions   Iohexol Anaphylaxis     Desc:  hives,rash,swelling. entered 02/10/2005 bsw    Alendronate Other (See Comments)    Other Reaction(s): interlorance   Codeine  Nausea Only   Prior to Admission medications   Medication Sig Start Date End Date Taking? Authorizing Provider  amLODipine  (NORVASC ) 5 MG tablet Take 1 tablet (5 mg total) by mouth daily. 07/09/24  Yes Hawks, Christy A, FNP  Cranberry 600 MG TABS Take 1 tablet by mouth daily.   Yes [provider]  Cyanocobalamin (TH VITAMIN B-12 PO) Take 1 tablet by mouth daily.   Yes [provider]  Lysine 500 MG CAPS Take 1 capsule by mouth daily.   Yes [provider]  Multiple Minerals-Vitamins (BONE DENSITY BUILDER PO) Take by mouth.   Yes [provider]  pantoprazole  (PROTONIX ) 40 MG tablet TAKE 1 TABLET BY MOUTH ONCE DAILY. APPOINTMENT NEEDED FOR FUTURE REFILLS 06/15/24  Yes Tabori, Katherine E, MD  Probiotic Product (PROBIOTIC-10) CAPS Take 1 capsule by mouth daily.   Yes [provider]  SALINE NASAL SPRAY NA Place into the nose 2 (two) times a day.   Yes [provider]   Social History   Socioeconomic History   Marital status: Widowed    Spouse name: Dempsey   Number of children: Not on file   Years of education: Not on file   Highest education level: 12th grade  Occupational History   Not on file  Tobacco Use  Smoking status: Never   Smokeless tobacco: Never  Vaping Use   Vaping status: Never Used  Substance and Sexual Activity   Alcohol use: Not Currently   Drug use: Not Currently   Sexual activity: Not Currently  Other Topics Concern   Not on file  Social History Narrative   Not on file   Social Drivers of Health   Financial Resource Strain: Low Risk  (04/24/2024)   Overall Financial Resource Strain (CARDIA)    Difficulty of Paying Living Expenses: Not hard at all  Food Insecurity: No Food Insecurity (04/24/2024)   Hunger Vital Sign    Worried About Running Out of Food in the Last Year: Never true     Ran Out of Food in the Last Year: Never true  Transportation Needs: No Transportation Needs (04/24/2024)   PRAPARE - Administrator, Civil Service (Medical): No    Lack of Transportation (Non-Medical): No  Physical Activity: Insufficiently Active (04/24/2024)   Exercise Vital Sign    Days of Exercise per Week: 3 days    Minutes of Exercise per Session: 20 min  Stress: No Stress Concern Present (04/24/2024)   [employer] of Occupational Health - Occupational Stress Questionnaire    Feeling of Stress: Only a little  Social Connections: Moderately Integrated (04/24/2024)   Social Connection and Isolation Panel    Frequency of Communication with Friends and Family: More than three times a week    Frequency of Social Gatherings with Friends and Family: Once a week    Attends Religious Services: More than 4 times per year    Active Member of [employer] or Organizations: Yes    Attends Banker Meetings: More than 4 times per year    Marital Status: Widowed  Intimate Partner Violence: Not At Risk (05/05/2023)   Humiliation, Afraid, Rape, and Kick questionnaire    Fear of Current or Ex-Partner: No    Emotionally Abused: No    Physically Abused: No    Sexually Abused: No    Review of Systems Per HPI.   Objective:   Vitals:   08/11/24 1124  BP: (!) 144/36  Pulse: 81  Resp: (!) 21  Temp: 99 F (37.2 C)  TempSrc: Temporal  SpO2: 99%  Weight: 126 lb (57.2 kg)  Height: 5' (1.524 m)     Physical Exam Vitals reviewed.  Constitutional:      General: She is not in acute distress.    Appearance: She is well-developed.  HENT:     Head: Normocephalic and atraumatic.     Right Ear: Hearing, tympanic membrane, ear canal and external ear normal.     Left Ear: Hearing, tympanic membrane, ear canal and external ear normal.     Nose: Nose normal.     Comments: Pressure in frontal and maxillary sinuses bilaterally.  No bleeding or active discharge in nasal passages.     Mouth/Throat:     Pharynx: No posterior oropharyngeal erythema.  Eyes:     Conjunctiva/sclera: Conjunctivae normal.     Pupils: Pupils are equal, round, and reactive to light.  Cardiovascular:     Rate and Rhythm: Normal rate and regular rhythm.     Heart sounds: Normal heart sounds. No murmur heard. Pulmonary:     Effort: Pulmonary effort is normal. No respiratory distress.     Breath sounds: Normal breath sounds. No wheezing, rhonchi or rales.  Lymphadenopathy:     Cervical: No cervical adenopathy.  Skin:  General: Skin is warm and dry.     Findings: No rash.  Neurological:     Mental Status: She is alert and oriented to person, place, and time.  Psychiatric:        Mood and Affect: Mood normal.        Behavior: Behavior normal.      Results for orders placed or performed in visit on 08/11/24  POC COVID-19   Collection Time: 08/11/24 11:38 AM  Result Value Ref Range   SARS Coronavirus 2 Ag Negative Negative     Assessment & Plan:  Glenda Rios is a 78 y.o. female . Nasal congestion - Plan: POC COVID-19, amoxicillin-clavulanate (AUGMENTIN) 875-125 MG tablet  Sinus pressure - Plan: amoxicillin-clavulanate (AUGMENTIN) 875-125 MG tablet  Acute sinusitis, recurrence not specified, unspecified location - Plan: amoxicillin-clavulanate (AUGMENTIN) 875-125 MG tablet  Acute cough - Plan: promethazine-dextromethorphan (PROMETHAZINE-DM) 6.25-15 MG/5ML syrup  Suspect initial viral illness with worsening sinus pressure, pain, cough and now discolored nasal discharge.  Possible early sinusitis, borderline temp in office.  Nontoxic-appearing.  Lungs were clear, unlikely pneumonia.  Symptomatic care discussed for cough during the day, saline nasal spray for nasal congestion, fluids, rest and handout given on URI, sinusitis.  Did start Augmentin with potential side effects and risk discussed, RTC precautions.  For nighttime cough, unfortunately she is allergic to codeine , we will  try low-dose Phenergan DM at bedtime if needed with potential risks and precautions including fall precautions discussed.  History of relatively low diastolic BP, similar to previous readings with PCP.  Continue to monitor.  Meds ordered this encounter  Medications   amoxicillin-clavulanate (AUGMENTIN) 875-125 MG tablet    Sig: Take 1 tablet by mouth 2 (two) times daily.    Dispense:  20 tablet    Refill:  0   promethazine-dextromethorphan (PROMETHAZINE-DM) 6.25-15 MG/5ML syrup    Sig: Take 5 mLs by mouth at bedtime as needed for cough.    Dispense:  118 mL    Refill:  0   Patient Instructions  Thank you for coming in today.  I am sorry that you are sick.  As we discussed this is right on the turning point of whether your symptoms are still due to a virus or early sinus infection.  I will prescribe Augmentin, but watch for some of the side effects we discussed.  Let us  know if there are issues.  Coricidin during the day is fine, along with fluids, rest and saline nasal spray.  I did send in some cough syrup if needed at night to allow some rest.  That medicine can cause significant amount of sedation so be careful taking that medication and watch for unsteadiness or risk for falls.  I expect your symptoms to be improving into next week, please be seen if not improving or sooner if any worsening symptoms but I do not expect that to occur.  Hope you feel better soon!  Sinus Infection, Adult A sinus infection, also called sinusitis, is inflammation of your sinuses. Sinuses are hollow spaces in the bones around your face. Your sinuses are located: Around your eyes. In the middle of your forehead. Behind your nose. In your cheekbones. Mucus normally drains out of your sinuses. When your nasal tissues become inflamed or swollen, mucus can become trapped or blocked. This allows bacteria, viruses, and fungi to grow, which leads to infection. Most infections of the sinuses are caused by a virus. A  sinus infection can develop quickly. It can  last for up to 4 weeks (acute) or for more than 12 weeks (chronic). A sinus infection often develops after a cold. What are the causes? This condition is caused by anything that creates swelling in the sinuses or stops mucus from draining. This includes: Allergies. Asthma. Infection from bacteria or viruses. Deformities or blockages in your nose or sinuses. Abnormal growths in the nose (nasal polyps). Pollutants, such as chemicals or irritants in the air. Infection from fungi. This is rare. What increases the risk? You are more likely to develop this condition if you: Have a weak body defense system (immune system). Do a lot of swimming or diving. Overuse nasal sprays. Smoke. What are the signs or symptoms? The main symptoms of this condition are pain and a feeling of pressure around the affected sinuses. Other symptoms include: Stuffy nose or congestion that makes it difficult to breathe through your nose. Thick yellow or greenish drainage from your nose. Tenderness, swelling, and warmth over the affected sinuses. A cough that may get worse at night. Decreased sense of smell and taste. Extra mucus that collects in the throat or the back of the nose (postnasal drip) causing a sore throat or bad breath. Tiredness (fatigue). Fever. How is this diagnosed? This condition is diagnosed based on: Your symptoms. Your medical history. A physical exam. Tests to find out if your condition is acute or chronic. This may include: Checking your nose for nasal polyps. Viewing your sinuses using a device that has a light (endoscope). Testing for allergies or bacteria. Imaging tests, such as an MRI or CT scan. In rare cases, a bone biopsy may be done to rule out more serious types of fungal sinus disease. How is this treated? Treatment for a sinus infection depends on the cause and whether your condition is chronic or acute. If caused by a virus, your  symptoms should go away on their own within 10 days. You may be given medicines to relieve symptoms. They include: Medicines that shrink swollen nasal passages (decongestants). A spray that eases inflammation of the nostrils (topical intranasal corticosteroids). Rinses that help get rid of thick mucus in your nose (nasal saline washes). Medicines that treat allergies (antihistamines). Over-the-counter pain relievers. If caused by bacteria, your health care provider may recommend waiting to see if your symptoms improve. Most bacterial infections will get better without antibiotic medicine. You may be given antibiotics if you have: A severe infection. A weak immune system. If caused by narrow nasal passages or nasal polyps, surgery may be needed. Follow these instructions at home: Medicines Take, use, or apply over-the-counter and prescription medicines only as told by your health care provider. These may include nasal sprays. If you were prescribed an antibiotic medicine, take it as told by your health care provider. Do not stop taking the antibiotic even if you start to feel better. Hydrate and humidify  Drink enough fluid to keep your urine pale yellow. Staying hydrated will help to thin your mucus. Use a cool mist humidifier to keep the humidity level in your home above 50%. Inhale steam for 10-15 minutes, 3-4 times a day, or as told by your health care provider. You can do this in the bathroom while a hot shower is running. Limit your exposure to cool or dry air. Rest Rest as much as possible. Sleep with your head raised (elevated). Make sure you get enough sleep each night. General instructions  Apply a warm, moist washcloth to your face 3-4 times a day or as  told by your health care provider. This will help with discomfort. Use nasal saline washes as often as told by your health care provider. Wash your hands often with soap and water to reduce your exposure to germs. If soap and  water are not available, use hand sanitizer. Do not smoke. Avoid being around people who are smoking (secondhand smoke). Keep all follow-up visits. This is important. Contact a health care provider if: You have a fever. Your symptoms get worse. Your symptoms do not improve within 10 days. Get help right away if: You have a severe headache. You have persistent vomiting. You have severe pain or swelling around your face or eyes. You have vision problems. You develop confusion. Your neck is stiff. You have trouble breathing. These symptoms may be an emergency. Get help right away. Call 911. Do not wait to see if the symptoms will go away. Do not drive yourself to the hospital. Summary A sinus infection is soreness and inflammation of your sinuses. Sinuses are hollow spaces in the bones around your face. This condition is caused by nasal tissues that become inflamed or swollen. The swelling traps or blocks the flow of mucus. This allows bacteria, viruses, and fungi to grow, which leads to infection. If you were prescribed an antibiotic medicine, take it as told by your health care provider. Do not stop taking the antibiotic even if you start to feel better. Keep all follow-up visits. This is important. This information is not intended to replace advice given to you by your health care provider. Make sure you discuss any questions you have with your health care provider. Document Revised: 09/30/2021 Document Reviewed: 09/30/2021 Elsevier Patient Education  2024 Elsevier Inc.  Upper Respiratory Infection, Adult An upper respiratory infection (URI) is a common viral infection of the nose, throat, and upper air passages that lead to the lungs. The most common type of URI is the common cold. URIs usually get better on their own, without medical treatment. What are the causes? A URI is caused by a virus. You may catch a virus by: Breathing in droplets from an infected person's cough or  sneeze. Touching something that has been exposed to the virus (is contaminated) and then touching your mouth, nose, or eyes. What increases the risk? You are more likely to get a URI if: You are very young or very old. You have close contact with others, such as at work, school, or a health care facility. You smoke. You have long-term (chronic) heart or lung disease. You have a weakened disease-fighting system (immune system). You have nasal allergies or asthma. You are experiencing a lot of stress. You have poor nutrition. What are the signs or symptoms? A URI usually involves some of the following symptoms: Runny or stuffy (congested) nose. Cough. Sneezing. Sore throat. Headache. Fatigue. Fever. Loss of appetite. Pain in your forehead, behind your eyes, and over your cheekbones (sinus pain). Muscle aches. Redness or irritation of the eyes. Pressure in the ears or face. How is this diagnosed? This condition may be diagnosed based on your medical history and symptoms, and a physical exam. Your health care provider may use a swab to take a mucus sample from your nose (nasal swab). This sample can be tested to determine what virus is causing the illness. How is this treated? URIs usually get better on their own within 7-10 days. Medicines cannot cure URIs, but your health care provider may recommend certain medicines to help relieve symptoms, such as: Over-the-counter  cold medicines. Cough suppressants. Coughing is a type of defense against infection that helps to clear the respiratory system, so take these medicines only as recommended by your health care provider. Fever-reducing medicines. Follow these instructions at home: Activity Rest as needed. If you have a fever, stay home from work or school until your fever is gone or until your health care provider says your URI cannot spread to other people (is no longer contagious). Your health care provider may have you wear a face  mask to prevent your infection from spreading. Relieving symptoms Gargle with a mixture of salt and water 3-4 times a day or as needed. To make salt water, completely dissolve -1 tsp (3-6 g) of salt in 1 cup (237 mL) of warm water. Use a cool-mist humidifier to add moisture to the air. This can help you breathe more easily. Eating and drinking  Drink enough fluid to keep your urine pale yellow. Eat soups and other clear broths. General instructions  Take over-the-counter and prescription medicines only as told by your health care provider. These include cold medicines, fever reducers, and cough suppressants. Do not use any products that contain nicotine or tobacco. These products include cigarettes, chewing tobacco, and vaping devices, such as e-cigarettes. If you need help quitting, ask your health care provider. Stay away from secondhand smoke. Stay up to date on all immunizations, including the yearly (annual) flu vaccine. Keep all follow-up visits. This is important. How to prevent the spread of infection to others URIs can be contagious. To prevent the infection from spreading: Wash your hands with soap and water for at least 20 seconds. If soap and water are not available, use hand sanitizer. Avoid touching your mouth, face, eyes, or nose. Cough or sneeze into a tissue or your sleeve or elbow instead of into your hand or into the air.  Contact a health care provider if: You are getting worse instead of better. You have a fever or chills. Your mucus is brown or red. You have yellow or brown discharge coming from your nose. You have pain in your face, especially when you bend forward. You have swollen neck glands. You have pain while swallowing. You have white areas in the back of your throat. Get help right away if: You have shortness of breath that gets worse. You have severe or persistent: Headache. Ear pain. Sinus pain. Chest pain. You have chronic lung disease along  with any of the following: Making high-pitched whistling sounds when you breathe, most often when you breathe out (wheezing). Prolonged cough (more than 14 days). Coughing up blood. A change in your usual mucus. You have a stiff neck. You have changes in your: Vision. Hearing. Thinking. Mood. These symptoms may be an emergency. Get help right away. Call 911. Do not wait to see if the symptoms will go away. Do not drive yourself to the hospital. Summary An upper respiratory infection (URI) is a common infection of the nose, throat, and upper air passages that lead to the lungs. A URI is caused by a virus. URIs usually get better on their own within 7-10 days. Medicines cannot cure URIs, but your health care provider may recommend certain medicines to help relieve symptoms. This information is not intended to replace advice given to you by your health care provider. Make sure you discuss any questions you have with your health care provider. Document Revised: 05/28/2021 Document Reviewed: 05/28/2021 Elsevier Patient Education  2024 [employer].    Signed,  Reyes Pines, MD [facility], [facility] 08/11/24 12:01 PM

## 2024-08-14 DIAGNOSIS — Z23 Encounter for immunization: Secondary | ICD-10-CM | POA: Diagnosis not present

## 2024-08-14 NOTE — Addendum Note (Signed)
 Addended by: SHARA BASCOM RAMAN on: 08/14/2024 03:30 PM   Modules accepted: Orders

## 2024-08-28 ENCOUNTER — Ambulatory Visit: Payer: Self-pay | Admitting: Family Medicine

## 2024-08-28 NOTE — Telephone Encounter (Signed)
 FYI Only or Action Required?: Action required by provider: update on patient condition.  Patient was last seen in primary care on 08/11/2024 by Glenda Reyes SAUNDERS, MD.  Called Nurse Triage reporting Sinusitis.  Symptoms began several weeks ago.  Interventions attempted: OTC medications: Tylenol and Prescription medications: amoxicillin-clavulanate (AUGMENTIN) 875-125 MG tablet.  Symptoms are: gradually improving.  Triage Disposition: See PCP When Office is Open (Within 3 Days)  Patient/caregiver understands and will follow disposition?: Yes               Copied from CRM #[id number]. Topic: Clinical - Red Word Triage >> Aug 28, 2024  9:57 AM Rea ORN wrote: Red Word that prompted transfer to Nurse Triage: lingering sx from 10/3 visit. Sinus pain in face, mucus. Pt was given abx at visit on 10/3 and has completed the rx but still has sx. Pt stated her mucus does not have color to it anymore but she still feels sick. Reason for Disposition  [1] Taking antibiotic > 7 days AND [2] nasal discharge not improved  Answer Assessment - Initial Assessment Questions This RN scheduled pt for an appointment with PCP on 10/22 in office. This RN will also send a high priority message to clinic for PCP to review. Pt is wondering if she needs another round of the antibiotic prescribed.This RN educated pt on new-worsening symptoms and when to call back. Pt verbalized understanding and agrees to plan.   Sinus congestion started 3 weeks ago  Sinus pressure above eyes (4/10 pain level) Denies cough, fever (99.6 F last night), difficulty breathing, sore throat Runny nose -clear mucous (not as bad as it was)   Pt took amoxicillin-clavulanate (AUGMENTIN) 875-125 MG tablet which pt finished Pt feels like the antibiotic didn't clear this up. Pt thinks she should be feeling better than she is after finishing the antibiotic  Protocols used: Sinus Infection on Antibiotic Follow-up Call-[facility]

## 2024-08-28 NOTE — Telephone Encounter (Signed)
 Patient has appt with you 08/30/24 to address concerns.

## 2024-08-30 ENCOUNTER — Encounter: Payer: Self-pay | Admitting: Family Medicine

## 2024-08-30 ENCOUNTER — Ambulatory Visit (INDEPENDENT_AMBULATORY_CARE_PROVIDER_SITE_OTHER): Admitting: Family Medicine

## 2024-08-30 VITALS — BP 134/62 | HR 78 | Temp 98.0°F | Ht 60.0 in | Wt 128.2 lb

## 2024-08-30 DIAGNOSIS — J069 Acute upper respiratory infection, unspecified: Secondary | ICD-10-CM

## 2024-08-30 MED ORDER — PREDNISONE 10 MG PO TABS: ORAL_TABLET | ORAL | 0 refills | Status: DC

## 2024-08-30 NOTE — Progress Notes (Signed)
   Subjective:    Patient ID: Glenda Rios Arm, female    DOB: [birth year], 78 y.o.   MRN: [id number]  HPI URI- pt was seen on 10/3 and tx'd w/ Augmentin.  Reports cough has improved somewhat but she is still struggling w/ fatigue and head congestion.  Has had low grade temps since Sunday.  Continues to have sinus pressure across forehead and behind her eyes.  Has been taking Coricidin and OTC decongestant w/ some relief.   Review of Systems For ROS see HPI     Objective:   Physical Exam Vitals reviewed.  Constitutional:      General: She is not in acute distress.    Appearance: Normal appearance. She is not ill-appearing.  HENT:     Head: Normocephalic and atraumatic.     Right Ear: Tympanic membrane and ear canal normal.     Left Ear: Tympanic membrane and ear canal normal.     Nose: Congestion present.     Comments: No TTP over frontal or maxillary sinuses Eyes:     Extraocular Movements: Extraocular movements intact.     Conjunctiva/sclera: Conjunctivae normal.  Cardiovascular:     Rate and Rhythm: Normal rate and regular rhythm.  Pulmonary:     Effort: Pulmonary effort is normal. No respiratory distress.     Breath sounds: No wheezing or rhonchi.  Musculoskeletal:     Cervical back: Neck supple.  Lymphadenopathy:     Cervical: No cervical adenopathy.  Skin:    General: Skin is warm and dry.  Neurological:     General: No focal deficit present.     Mental Status: She is alert and oriented to person, place, and time.  Psychiatric:        Mood and Affect: Mood normal.        Behavior: Behavior normal.        Thought Content: Thought content normal.           Assessment & Plan:  URI- pt was tx'd w/ Augmentin earlier this month but she is still struggling w/ fatigue, congestion, and sinus pressure.  No evidence of bacterial infxn on PE.  Suspect inflammation causing pain/pressure.  Start Prednisone taper.  Reviewed supportive care and red flags that should prompt  return.  Pt expressed understanding and is in agreement w/ plan.

## 2024-08-30 NOTE — Patient Instructions (Signed)
 Follow up as needed or as scheduled START the Prednisone as directed- 3 pills at the same time x3 days, then 2 pills at the same time x3 days, then 1 pill daily.  Take w/ food  Drink LOTS of fluids REST! Call with any questions or concerns Hang in there!!!

## 2024-10-02 ENCOUNTER — Other Ambulatory Visit: Payer: Self-pay | Admitting: Family

## 2024-10-02 DIAGNOSIS — I10 Essential (primary) hypertension: Secondary | ICD-10-CM

## 2024-10-17 ENCOUNTER — Ambulatory Visit

## 2024-10-17 VITALS — Ht 60.0 in | Wt 128.0 lb

## 2024-10-17 DIAGNOSIS — Z0000 Encounter for general adult medical examination without abnormal findings: Secondary | ICD-10-CM

## 2024-10-17 NOTE — Patient Instructions (Signed)
 Glenda Rios,  Thank you for taking the time for your Medicare Wellness Visit. I appreciate your continued commitment to your health goals. Please review the care plan we discussed, and feel free to reach out if I can assist you further.  Please note that Annual Wellness Visits do not include a physical exam. Some assessments may be limited, especially if the visit was conducted virtually. If needed, we may recommend an in-person follow-up with your provider.  Ongoing Care Seeing your primary care provider every 3 to 6 months helps us  monitor your health and provide consistent, personalized care.   Referrals If a referral was made during today's visit and you haven't received any updates within two weeks, please contact the referred provider directly to check on the status.  Recommended Screenings:  Health Maintenance  Topic Date Due   Zoster (Shingles) Vaccine (1 of 2) Never done   DTaP/Tdap/Td vaccine (2 - Td or Tdap) 08/30/2017   COVID-19 Vaccine (3 - Moderna risk series) 10/24/2020   Breast Cancer Screening  03/15/2025   Medicare Annual Wellness Visit  10/17/2025   Colon Cancer Screening  04/17/2027   Pneumococcal Vaccine for age over 33  Completed   Flu Shot  Completed   Osteoporosis screening with Bone Density Scan  Completed   Hepatitis C Screening  Completed   Meningitis B Vaccine  Aged Out       10/17/2024    1:16 PM  Advanced Directives  Does Patient Have a Medical Advance Directive? Yes  Type of Advance Directive Living will  Does patient want to make changes to medical advance directive? No - Patient declined    Vision: Annual vision screenings are recommended for early detection of glaucoma, cataracts, and diabetic retinopathy. These exams can also reveal signs of chronic conditions such as diabetes and high blood pressure.  Dental: Annual dental screenings help detect early signs of oral cancer, gum disease, and other conditions linked to overall health, including  heart disease and diabetes.  Please see the attached documents for additional preventive care   Glenda Rios , Thank you for taking time to come for your Medicare Wellness Visit. I appreciate your ongoing commitment to your health goals. Please review the following plan we discussed and let me know if I can assist you in the future.   Screening recommendations/referrals: Colonoscopy:  Mammogram:  Bone Density:  Recommended yearly ophthalmology/optometry visit for glaucoma screening and checkup Recommended yearly dental visit for hygiene and checkup  Vaccinations: Influenza vaccine:  Pneumococcal vaccine:  Tdap vaccine:  Shingles vaccine:      Preventive Care 65 Years and Older, Female Preventive care refers to lifestyle choices and visits with your health care provider that can promote health and wellness. What does preventive care include? A yearly physical exam. This is also called an annual well check. Dental exams once or twice a year. Routine eye exams. Ask your health care provider how often you should have your eyes checked. Personal lifestyle choices, including: Daily care of your teeth and gums. Regular physical activity. Eating a healthy diet. Avoiding tobacco and drug use. Limiting alcohol use. Practicing safe sex. Taking low-dose aspirin every day. Taking vitamin and mineral supplements as recommended by your health care provider. What happens during an annual well check? The services and screenings done by your health care provider during your annual well check will depend on your age, overall health, lifestyle risk factors, and family history of disease. Counseling  Your health care provider may ask  you questions about your: Alcohol use. Tobacco use. Drug use. Emotional well-being. Home and relationship well-being. Sexual activity. Eating habits. History of falls. Memory and ability to understand (cognition). Work and work astronomer. Reproductive  health. Screening  You may have the following tests or measurements: Height, weight, and BMI. Blood pressure. Lipid and cholesterol levels. These may be checked every 5 years, or more frequently if you are over 32 years old. Skin check. Lung cancer screening. You may have this screening every year starting at age 52 if you have a 30-pack-year history of smoking and currently smoke or have quit within the past 15 years. Fecal occult blood test (FOBT) of the stool. You may have this test every year starting at age 92. Flexible sigmoidoscopy or colonoscopy. You may have a sigmoidoscopy every 5 years or a colonoscopy every 10 years starting at age 87. Hepatitis C blood test. Hepatitis B blood test. Sexually transmitted disease (STD) testing. Diabetes screening. This is done by checking your blood sugar (glucose) after you have not eaten for a while (fasting). You may have this done every 1-3 years. Bone density scan. This is done to screen for osteoporosis. You may have this done starting at age 20. Mammogram. This may be done every 1-2 years. Talk to your health care provider about how often you should have regular mammograms. Talk with your health care provider about your test results, treatment options, and if necessary, the need for more tests. Vaccines  Your health care provider may recommend certain vaccines, such as: Influenza vaccine. This is recommended every year. Tetanus, diphtheria, and acellular pertussis (Tdap, Td) vaccine. You may need a Td booster every 10 years. Zoster vaccine. You may need this after age 107. Pneumococcal 13-valent conjugate (PCV13) vaccine. One dose is recommended after age 44. Pneumococcal polysaccharide (PPSV23) vaccine. One dose is recommended after age 35. Talk to your health care provider about which screenings and vaccines you need and how often you need them. This information is not intended to replace advice given to you by your health care provider.  Make sure you discuss any questions you have with your health care provider. Document Released: 11/22/2015 Document Revised: 07/15/2016 Document Reviewed: 08/27/2015 Elsevier Interactive Patient Education  2017 [employer].  Fall Prevention in the Home Falls can cause injuries. They can happen to people of all ages. There are many things you can do to make your home safe and to help prevent falls. What can I do on the outside of my home? Regularly fix the edges of walkways and driveways and fix any cracks. Remove anything that might make you trip as you walk through a door, such as a raised step or threshold. Trim any bushes or trees on the path to your home. Use bright outdoor lighting. Clear any walking paths of anything that might make someone trip, such as rocks or tools. Regularly check to see if handrails are loose or broken. Make sure that both sides of any steps have handrails. Any raised decks and porches should have guardrails on the edges. Have any leaves, snow, or ice cleared regularly. Use sand or salt on walking paths during winter. Clean up any spills in your garage right away. This includes oil or grease spills. What can I do in the bathroom? Use night lights. Install grab bars by the toilet and in the tub and shower. Do not use towel bars as grab bars. Use non-skid mats or decals in the tub or shower. If you need to  sit down in the shower, use a plastic, non-slip stool. Keep the floor dry. Clean up any water that spills on the floor as soon as it happens. Remove soap buildup in the tub or shower regularly. Attach bath mats securely with double-sided non-slip rug tape. Do not have throw rugs and other things on the floor that can make you trip. What can I do in the bedroom? Use night lights. Make sure that you have a light by your bed that is easy to reach. Do not use any sheets or blankets that are too big for your bed. They should not hang down onto the floor. Have a  firm chair that has side arms. You can use this for support while you get dressed. Do not have throw rugs and other things on the floor that can make you trip. What can I do in the kitchen? Clean up any spills right away. Avoid walking on wet floors. Keep items that you use a lot in easy-to-reach places. If you need to reach something above you, use a strong step stool that has a grab bar. Keep electrical cords out of the way. Do not use floor polish or wax that makes floors slippery. If you must use wax, use non-skid floor wax. Do not have throw rugs and other things on the floor that can make you trip. What can I do with my stairs? Do not leave any items on the stairs. Make sure that there are handrails on both sides of the stairs and use them. Fix handrails that are broken or loose. Make sure that handrails are as long as the stairways. Check any carpeting to make sure that it is firmly attached to the stairs. Fix any carpet that is loose or worn. Avoid having throw rugs at the top or bottom of the stairs. If you do have throw rugs, attach them to the floor with carpet tape. Make sure that you have a light switch at the top of the stairs and the bottom of the stairs. If you do not have them, ask someone to add them for you. What else can I do to help prevent falls? Wear shoes that: Do not have high heels. Have rubber bottoms. Are comfortable and fit you well. Are closed at the toe. Do not wear sandals. If you use a stepladder: Make sure that it is fully opened. Do not climb a closed stepladder. Make sure that both sides of the stepladder are locked into place. Ask someone to hold it for you, if possible. Clearly mark and make sure that you can see: Any grab bars or handrails. First and last steps. Where the edge of each step is. Use tools that help you move around (mobility aids) if they are needed. These include: Canes. Walkers. Scooters. Crutches. Turn on the lights when you  go into a dark area. Replace any light bulbs as soon as they burn out. Set up your furniture so you have a clear path. Avoid moving your furniture around. If any of your floors are uneven, fix them. If there are any pets around you, be aware of where they are. Review your medicines with your doctor. Some medicines can make you feel dizzy. This can increase your chance of falling. Ask your doctor what other things that you can do to help prevent falls. This information is not intended to replace advice given to you by your health care provider. Make sure you discuss any questions you have with your health care  provider. Document Released: 08/22/2009 Document Revised: 04/02/2016 Document Reviewed: 11/30/2014 Elsevier Interactive Patient Education  2017 [employer]. recommendations.

## 2024-10-17 NOTE — Progress Notes (Signed)
 Chief Complaint  Patient presents with   Medicare Wellness     Subjective:   Glenda Rios is a 78 y.o. female who presents for a Medicare Annual Wellness Visit.  Visit info / Clinical Intake: Medicare Wellness Visit Type:: Subsequent Annual Wellness Visit Persons participating in visit and providing information:: patient Medicare Wellness Visit Mode:: Telephone If telephone:: video declined Since this visit was completed virtually, some vitals may be partially provided or unavailable. Missing vitals are due to the limitations of the virtual format.: Unable to obtain vitals - no equipment If Telephone or Video please confirm:: I connected with patient using audio/video enable telemedicine. I verified patient identity with two identifiers, discussed telehealth limitations, and patient agreed to proceed. Patient Location:: home Provider Location:: home Interpreter Needed?: No Pre-visit prep was completed: no AWV questionnaire completed by patient prior to visit?: yes Date:: 10/13/24 Living arrangements:: (!) lives alone Patient's Overall Health Status Rating: good Typical amount of pain: some Does pain affect daily life?: no  Dietary Habits and Nutritional Risks How many meals a day?: 3 Eats fruit and vegetables daily?: yes Most meals are obtained by: preparing own meals In the last 2 weeks, have you had any of the following?: none Diabetic:: no  Functional Status Activities of Daily Living (to include ambulation/medication): Independent Ambulation: Independent Medication Administration: Independent Home Management (perform basic housework or laundry): Independent Manage your own finances?: yes Primary transportation is: driving Concerns about hearing?: no  Fall Screening Falls in the past year?: 0 Number of falls in past year: 0 Was there an injury with Fall?: 0 Fall Risk Category Calculator: 0 Patient Fall Risk Level: Low Fall Risk  Fall Risk Patient at Risk  for Falls Due to: No Fall Risks Fall risk Follow up: Falls evaluation completed; Education provided; Falls prevention discussed  Home and Transportation Safety: All rugs have non-skid backing?: yes All stairs or steps have railings?: yes Grab bars in the bathtub or shower?: yes Have non-skid surface in bathtub or shower?: yes Good home lighting?: yes Regular seat belt use?: yes Hospital stays in the last year:: no  Cognitive Assessment Difficulty concentrating, remembering, or making decisions? : no Will 6CIT or Mini Cog be Completed: yes What year is it?: 0 points What month is it?: 0 points Give patient an address phrase to remember (5 components): Its very sunny outside today in December About what time is it?: 0 points Count backwards from 20 to 1: 0 points Say the months of the year in reverse: 0 points Repeat the address phrase from earlier: 0 points 6 CIT Score: 0 points  Advance Directives (For Healthcare) Does Patient Have a Medical Advance Directive?: Yes Does patient want to make changes to medical advance directive?: No - Patient declined Type of Advance Directive: Living will Copy of Living Will in Chart?: No - copy requested  Reviewed/Updated  Reviewed/Updated: Reviewed All (Medical, Surgical, Family, Medications, Allergies, Care Teams, Patient Goals); Surgical History; Family History; Medications; Care Teams; Allergies; Patient Goals; Medical History    Allergies (verified) Iohexol, Alendronate, and Codeine    Current Medications (verified) Outpatient Encounter Medications as of 10/17/2024  Medication Sig   amLODipine  (NORVASC ) 5 MG tablet Take 1 tablet (5 mg total) by mouth daily.   Cranberry 600 MG TABS Take 1 tablet by mouth daily.   Cyanocobalamin (TH VITAMIN B-12 PO) Take 1 tablet by mouth daily.   Lysine 500 MG CAPS Take 1 capsule by mouth daily.   Multiple Minerals-Vitamins (BONE DENSITY BUILDER  PO) Take by mouth.   pantoprazole  (PROTONIX ) 40 MG  tablet TAKE 1 TABLET BY MOUTH ONCE DAILY. APPOINTMENT NEEDED FOR FUTURE REFILLS   Probiotic Product (PROBIOTIC-10) CAPS Take 1 capsule by mouth daily.   SALINE NASAL SPRAY NA Place into the nose 2 (two) times a day.   predniSONE  (DELTASONE ) 10 MG tablet 3 tabs x3 days and then 2 tabs x3 days and then 1 tab x3 days.  Take w/ food.   promethazine -dextromethorphan (PROMETHAZINE -DM) 6.25-15 MG/5ML syrup Take 5 mLs by mouth at bedtime as needed for cough.   No facility-administered encounter medications on file as of 10/17/2024.    History: Past Medical History:  Diagnosis Date   Allergy    Arthritis    Cataract    Colon polyps    GERD (gastroesophageal reflux disease)    History of chicken pox    Hypertension    Osteoporosis    Thyroid  disease    Past Surgical History:  Procedure Laterality Date   APPENDECTOMY  1995   BACK SURGERY  2007, 2012   CATARACT EXTRACTION Right 11/18/2023   CHOLECYSTECTOMY  1995   Family History  Problem Relation Age of Onset   Breast cancer Maternal Aunt    Hypertension Mother    Stroke Mother    Cancer Mother        Bone Cancer   Diabetes Father    Heart disease Sister    Alcohol abuse Sister    Heart disease Sister    Heart disease Sister    Rheumatic fever Sister    Social History   Occupational History   Not on file  Tobacco Use   Smoking status: Never   Smokeless tobacco: Never  Vaping Use   Vaping status: Never Used  Substance and Sexual Activity   Alcohol use: Not Currently   Drug use: Not Currently   Sexual activity: Not Currently   Tobacco Counseling Counseling given: Not Answered  SDOH Screenings   Food Insecurity: No Food Insecurity (10/17/2024)  Housing: Low Risk  (10/17/2024)  Transportation Needs: No Transportation Needs (10/17/2024)  Utilities: Not At Risk (10/17/2024)  Alcohol Screen: Low Risk  (05/05/2023)  Depression (PHQ2-9): Low Risk  (11/01/2023)  Financial Resource Strain: Low Risk  (08/28/2024)  Physical  Activity: Insufficiently Active (10/17/2024)  Social Connections: Moderately Integrated (10/17/2024)  Stress: No Stress Concern Present (10/17/2024)  Tobacco Use: Low Risk  (10/17/2024)  Health Literacy: Adequate Health Literacy (10/17/2024)   See flowsheets for full screening details  Depression Screen PHQ 2 & 9 Depression Scale- Over the past 2 weeks, how often have you been bothered by any of the following problems? Little interest or pleasure in doing things: 0 Feeling down, depressed, or hopeless (PHQ Adolescent also includes...irritable): 0 PHQ-2 Total Score: 0 Trouble falling or staying asleep, or sleeping too much: 0 Feeling tired or having little energy: 0 Poor appetite or overeating (PHQ Adolescent also includes...weight loss): 0 Trouble concentrating on things, such as reading the newspaper or watching television [facility] Adolescent also includes...like school work): 0 Moving or speaking so slowly that other people could have noticed. Or the opposite - being so fidgety or restless that you have been moving around a lot more than usual: 0 Thoughts that you would be better off dead, or of hurting yourself in some way: 0 PHQ-9 Total Score: 0 If you checked off any problems, how difficult have these problems made it for you to do your work, take care of things at home, or  get along with other people?: Not difficult at all     Goals Addressed             This Visit's Progress    Patient Stated   On track    Maintain current health by staying active.      Patient Stated   On track    Patient would like to bike on her stationary bike be able to go over a mile     Patient Stated   On track    Continue current lifestyle     Patient Stated       Maintain current lifestyle              Objective:    Today's Vitals   10/17/24 1314  Weight: 128 lb (58.1 kg)  Height: 5' (1.524 m)   Body mass index is 25 kg/m.  Hearing/Vision screen Hearing Screening - Comments:: No  trouble hearing Vision Screening - Comments:: [place] Up to date Immunizations and Health Maintenance Health Maintenance  Topic Date Due   Zoster Vaccines- Shingrix (1 of 2) Never done   DTaP/Tdap/Td (2 - Td or Tdap) 08/30/2017   COVID-19 Vaccine (3 - Moderna risk series) 10/24/2020   Mammogram  03/15/2025   Medicare Annual Wellness (AWV)  10/17/2025   Colonoscopy  04/17/2027   Pneumococcal Vaccine: 50+ Years  Completed   Influenza Vaccine  Completed   Bone Density Scan  Completed   Hepatitis C Screening  Completed   Meningococcal B Vaccine  Aged Out        Assessment/Plan:  This is a routine wellness examination for Paullette.  Patient Care Team: Mahlon Comer BRAVO, MD as PCP - General (Family Medicine) Lennard Lesta FALCON, MD as Consulting Physician (Gastroenterology) Leslee Reusing, MD as Consulting Physician (Ophthalmology) Ishmael Slough, MD as Consulting Physician (Rheumatology) Timmie Norris, MD as Consulting Physician (Obstetrics and Gynecology) Matilda Senior, MD as Consulting Physician (Urology) Nicholaus Sherlean CROME, [facility] (Inactive) (Pharmacist)  I have personally reviewed and noted the following in the patient's chart:   Medical and social history Use of alcohol, tobacco or illicit drugs  Current medications and supplements including opioid prescriptions. Functional ability and status Nutritional status Physical activity Advanced directives List of other physicians Hospitalizations, surgeries, and ER visits in previous 12 months Vitals Screenings to include cognitive, depression, and falls Referrals and appointments  No orders of the defined types were placed in this encounter.  In addition, I have reviewed and discussed with patient certain preventive protocols, quality metrics, and best practice recommendations. A written personalized care plan for preventive services as well as general preventive health recommendations were provided to patient.   Mliss Graff, LPN   [id number]   Return in 1 year (on 10/17/2025).  After Visit Summary: (MyChart) Due to this being a telephonic visit, the after visit summary with patients personalized plan was offered to patient via MyChart   Nurse Notes:

## 2024-11-06 ENCOUNTER — Ambulatory Visit: Admitting: Family Medicine

## 2024-11-06 ENCOUNTER — Encounter: Payer: Self-pay | Admitting: Family Medicine

## 2024-11-06 VITALS — BP 142/74 | HR 90 | Temp 98.6°F | Ht 60.0 in | Wt 128.0 lb

## 2024-11-06 DIAGNOSIS — E559 Vitamin D deficiency, unspecified: Secondary | ICD-10-CM | POA: Diagnosis not present

## 2024-11-06 DIAGNOSIS — I10 Essential (primary) hypertension: Secondary | ICD-10-CM | POA: Diagnosis not present

## 2024-11-06 DIAGNOSIS — Z0000 Encounter for general adult medical examination without abnormal findings: Secondary | ICD-10-CM | POA: Diagnosis not present

## 2024-11-06 LAB — LIPID PANEL
Cholesterol: 165 mg/dL (ref 28–200)
HDL: 75.1 mg/dL
LDL Cholesterol: 80 mg/dL (ref 10–99)
NonHDL: 90.32
Total CHOL/HDL Ratio: 2
Triglycerides: 52 mg/dL (ref 10.0–149.0)
VLDL: 10.4 mg/dL (ref 0.0–40.0)

## 2024-11-06 LAB — BASIC METABOLIC PANEL WITH GFR
BUN: 21 mg/dL (ref 6–23)
CO2: 29 meq/L (ref 19–32)
Calcium: 9.5 mg/dL (ref 8.4–10.5)
Chloride: 103 meq/L (ref 96–112)
Creatinine, Ser: 0.85 mg/dL (ref 0.40–1.20)
GFR: 66.61 mL/min
Glucose, Bld: 97 mg/dL (ref 70–99)
Potassium: 4.1 meq/L (ref 3.5–5.1)
Sodium: 140 meq/L (ref 135–145)

## 2024-11-06 LAB — HEPATIC FUNCTION PANEL
ALT: 20 U/L (ref 3–35)
AST: 23 U/L (ref 5–37)
Albumin: 4.3 g/dL (ref 3.5–5.2)
Alkaline Phosphatase: 44 U/L (ref 39–117)
Bilirubin, Direct: 0.1 mg/dL (ref 0.1–0.3)
Total Bilirubin: 0.4 mg/dL (ref 0.2–1.2)
Total Protein: 6.7 g/dL (ref 6.0–8.3)

## 2024-11-06 LAB — CBC WITH DIFFERENTIAL/PLATELET
Basophils Absolute: 0 K/uL (ref 0.0–0.1)
Basophils Relative: 0.7 % (ref 0.0–3.0)
Eosinophils Absolute: 0.1 K/uL (ref 0.0–0.7)
Eosinophils Relative: 1.9 % (ref 0.0–5.0)
HCT: 32.5 % — ABNORMAL LOW (ref 36.0–46.0)
Hemoglobin: 10.9 g/dL — ABNORMAL LOW (ref 12.0–15.0)
Lymphocytes Relative: 22 % (ref 12.0–46.0)
Lymphs Abs: 1.3 K/uL (ref 0.7–4.0)
MCHC: 33.6 g/dL (ref 30.0–36.0)
MCV: 85.8 fl (ref 78.0–100.0)
Monocytes Absolute: 0.5 K/uL (ref 0.1–1.0)
Monocytes Relative: 9.2 % (ref 3.0–12.0)
Neutro Abs: 3.9 K/uL (ref 1.4–7.7)
Neutrophils Relative %: 66.2 % (ref 43.0–77.0)
Platelets: 241 K/uL (ref 150.0–400.0)
RBC: 3.79 Mil/uL — ABNORMAL LOW (ref 3.87–5.11)
RDW: 13.6 % (ref 11.5–15.5)
WBC: 5.8 K/uL (ref 4.0–10.5)

## 2024-11-06 LAB — TSH: TSH: 2.56 u[IU]/mL (ref 0.35–5.50)

## 2024-11-06 LAB — VITAMIN D 25 HYDROXY (VIT D DEFICIENCY, FRACTURES): VITD: 54.15 ng/mL (ref 30.00–100.00)

## 2024-11-06 MED ORDER — AMLODIPINE BESYLATE 5 MG PO TABS: 5.0000 mg | ORAL_TABLET | Freq: Every day | ORAL | 0 refills | Status: AC

## 2024-11-06 NOTE — Progress Notes (Signed)
" ° °  Subjective:    Patient ID: Glenda Rios Arm, female    DOB: Mar 22, 1946, 78 y.o.   MRN: [id number]  HPI CPE- UTD on mammo, colonoscopy, PNA, flu  Health Maintenance  Topic Date Due   COVID-19 Vaccine (3 - Moderna risk series) 11/22/2024 (Originally 10/24/2020)   Zoster Vaccines- Shingrix (1 of 2) 02/04/2025 (Originally 07/20/1967)   DTaP/Tdap/Td (2 - Td or Tdap) 11/06/2025 (Originally 08/30/2017)   Mammogram  03/15/2025   Medicare Annual Wellness (AWV)  10/17/2025   Colonoscopy  04/17/2027   Pneumococcal Vaccine: 50+ Years  Completed   Influenza Vaccine  Completed   Bone Density Scan  Completed   Hepatitis C Screening  Completed   Meningococcal B Vaccine  Aged Out    Patient Care Team    Relationship Specialty Notifications Start End  Mahlon Comer BRAVO, MD PCP - General Family Medicine  08/23/18   Lennard Lesta FALCON, MD Consulting Physician Gastroenterology  08/23/18   Leslee Reusing, MD Consulting Physician Ophthalmology  08/23/18   Ishmael Slough, MD Consulting Physician Rheumatology  08/23/18   Timmie Norris, MD Consulting Physician Obstetrics and Gynecology  08/23/18   Matilda Senior, MD Consulting Physician Urology  08/23/18   Nicholaus Sherlean CROME, [facility] (Inactive)  Pharmacist  09/10/22    Comment: [phone]      Review of Systems Patient reports no vision/ hearing changes, adenopathy,fever, weight change,  persistant/recurrent hoarseness , swallowing issues, chest pain, palpitations, edema, persistant/recurrent cough, hemoptysis, dyspnea (rest/exertional/paroxysmal nocturnal), gastrointestinal bleeding (melena, rectal bleeding), abdominal pain, significant heartburn, bowel changes, GU symptoms (dysuria, hematuria, incontinence), Gyn symptoms (abnormal  bleeding, pain),  syncope, focal weakness, memory loss, numbness & tingling, skin/hair/nail changes, abnormal bruising or bleeding, anxiety, or depression.     Objective:   Physical Exam General Appearance:    Alert,  cooperative, no distress, appears stated age  Head:    Normocephalic, without obvious abnormality, atraumatic  Eyes:    PERRL, conjunctiva/corneas clear, EOM's intact both eyes  Ears:    Normal TM's and external ear canals, both ears  Nose:   Nares normal, septum midline, mucosa normal, no drainage    or sinus tenderness  Throat:   Lips, mucosa, and tongue normal; teeth and gums normal  Neck:   Supple, symmetrical, trachea midline, no adenopathy;    Thyroid : no enlargement/tenderness/nodules  Back:     Symmetric, no curvature, ROM normal, no CVA tenderness  Lungs:     Clear to auscultation bilaterally, respirations unlabored  Chest Wall:    No tenderness or deformity   Heart:    Regular rate and rhythm, S1 and S2 normal, no murmur, rub   or gallop  Breast Exam:    Deferred to mammo  Abdomen:     Soft, non-tender, bowel sounds active all four quadrants,    no masses, no organomegaly  Genitalia:    Deferred to GYN  Rectal:    Extremities:   Extremities normal, atraumatic, no cyanosis or edema  Pulses:   2+ and symmetric all extremities  Skin:   Skin color, texture, turgor normal, no rashes or lesions  Lymph nodes:   Cervical, supraclavicular, and axillary nodes normal  Neurologic:   CNII-XII intact, normal strength, sensation and reflexes    throughout          Assessment & Plan:    "

## 2024-11-06 NOTE — Assessment & Plan Note (Signed)
 Pt's PE WNL w/ exception of elevated BP.  UTD on mammo, colonoscopy, PNA, flu.  Check labs.  Anticipatory guidance provided.

## 2024-11-06 NOTE — Patient Instructions (Signed)
 Follow up in 1 month to recheck blood pressure We'll notify you of your lab results and make any changes if needed RESTART the Amlodipine  once daily Keep up the good work!  You look great! Call with any questions or concerns Stay Safe!  Stay Healthy! Happy New Year!

## 2024-11-08 ENCOUNTER — Ambulatory Visit: Payer: Self-pay | Admitting: Family Medicine

## 2024-11-08 DIAGNOSIS — D649 Anemia, unspecified: Secondary | ICD-10-CM

## 2024-12-08 ENCOUNTER — Ambulatory Visit: Admitting: Family Medicine

## 2024-12-15 ENCOUNTER — Other Ambulatory Visit: Payer: Self-pay | Admitting: Family Medicine

## 2024-12-26 ENCOUNTER — Ambulatory Visit: Admitting: Family Medicine
